# Patient Record
Sex: Male | Born: 1950 | Race: White | Hispanic: No | Marital: Single | Smoking: Never smoker
Health system: Southern US, Community
[De-identification: ages and names within clinical notes are randomized; demographics above are authoritative.]

## PROBLEM LIST (undated history)

## (undated) DIAGNOSIS — E119 Type 2 diabetes mellitus without complications: Secondary | ICD-10-CM

---

## 2006-11-25 ENCOUNTER — Inpatient Hospital Stay (HOSPITAL_COMMUNITY): Admission: AD | Admit: 2006-11-25 | Discharge: 2006-12-05 | Payer: Self-pay | Admitting: Psychiatry

## 2006-11-25 ENCOUNTER — Ambulatory Visit: Payer: Self-pay | Admitting: Psychiatry

## 2006-11-25 ENCOUNTER — Emergency Department (HOSPITAL_COMMUNITY): Admission: EM | Admit: 2006-11-25 | Discharge: 2006-11-25 | Payer: Self-pay | Admitting: Emergency Medicine

## 2006-11-28 ENCOUNTER — Ambulatory Visit: Payer: Self-pay | Admitting: *Deleted

## 2006-12-01 ENCOUNTER — Ambulatory Visit: Admission: RE | Admit: 2006-12-01 | Discharge: 2006-12-01 | Payer: Self-pay | Admitting: Psychiatry

## 2006-12-06 ENCOUNTER — Inpatient Hospital Stay (HOSPITAL_COMMUNITY): Admission: AD | Admit: 2006-12-06 | Discharge: 2006-12-08 | Payer: Self-pay | Admitting: Psychiatry

## 2006-12-09 ENCOUNTER — Ambulatory Visit: Payer: Self-pay | Admitting: Psychiatry

## 2006-12-31 ENCOUNTER — Ambulatory Visit: Admission: RE | Admit: 2006-12-31 | Discharge: 2006-12-31 | Payer: Self-pay | Admitting: Family Medicine

## 2006-12-31 ENCOUNTER — Ambulatory Visit: Payer: Self-pay | Admitting: Vascular Surgery

## 2008-05-10 IMAGING — CT CT CHEST W/ CM
2 of 5 series · 16 of 46 positions shown, 18 images · IV contrast (omnipaque)
Comparison: None

CHEST CT WITH CONTRAST

CLINICAL DATA: Lower extremity edema. Psychosis. Shortness of breath. Nausea.
TECHNIQUE: Multidetector CT imaging of the chest, abdomen, and pelvis was
performed following the standard protocol during bolus administration of
intravenous contrast.

Contrast:  100 cc Omnipaque 300

[Series 2: cap 5.0 b40f · axial · 0.88mm/px · z∈[-646,-76]mm · 13 of 132 slices shown, 15 images]
[im 9/132  soft-tissue]
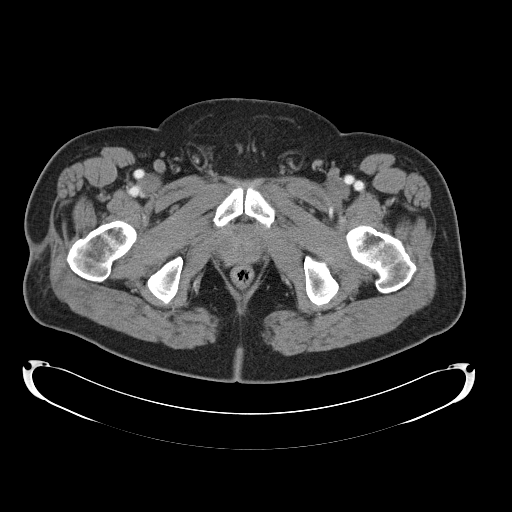
[im 9/132  bone]
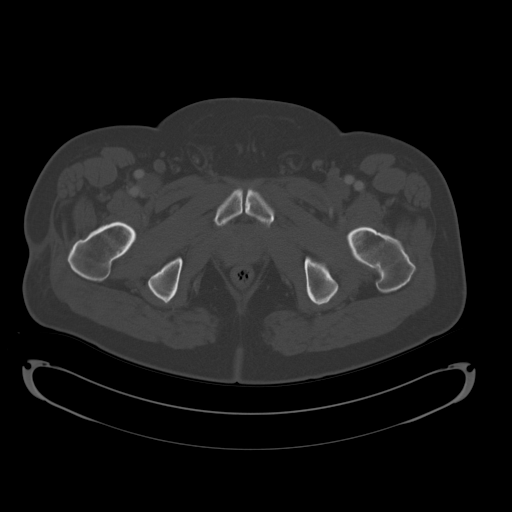
[im 17/132  soft-tissue]
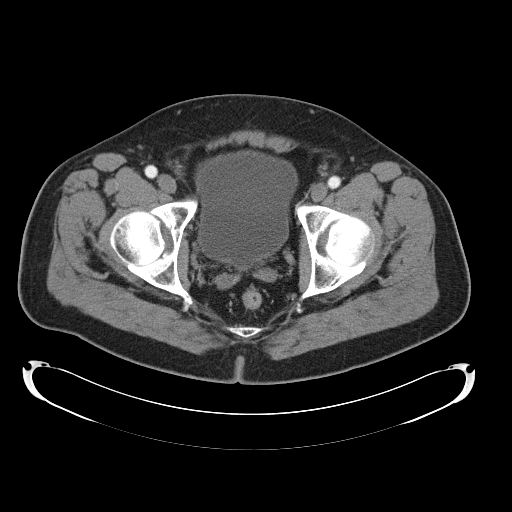
[im 25/132  soft-tissue]
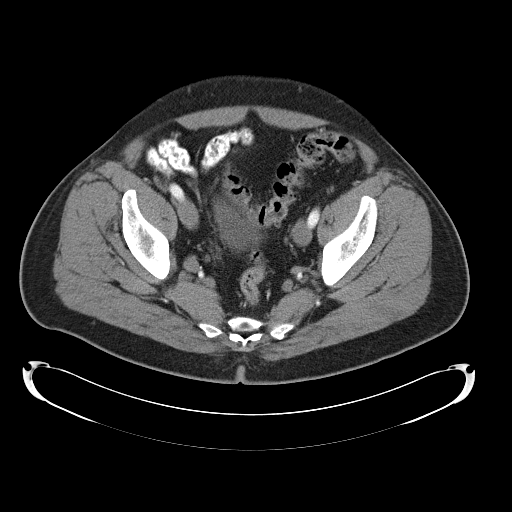
[im 41/132  soft-tissue]
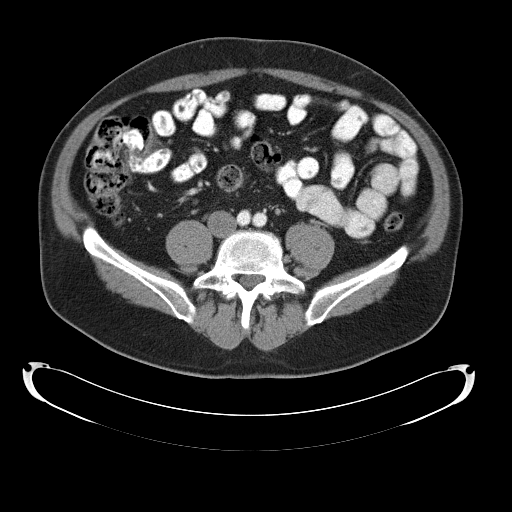
[im 50/132  soft-tissue]
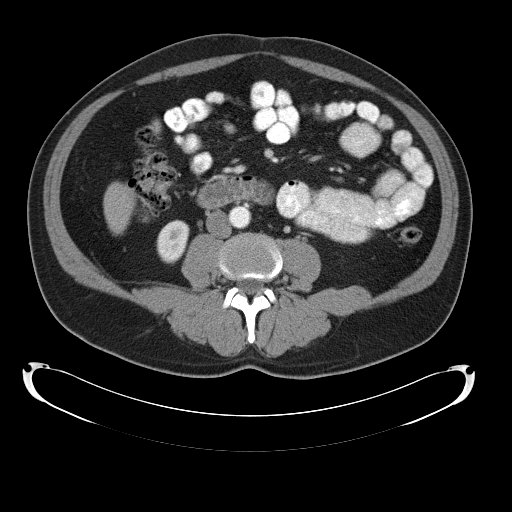
[im 58/132  soft-tissue]
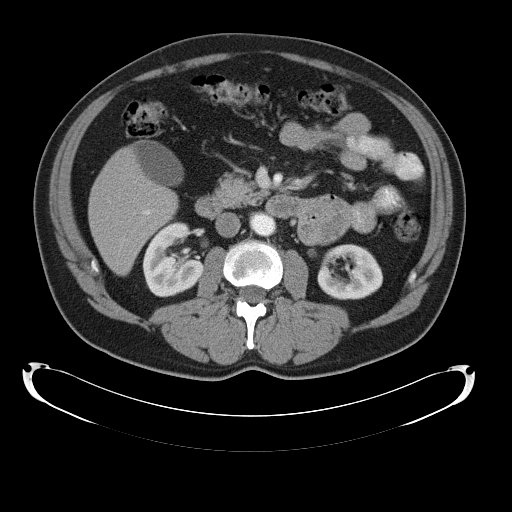
[im 66/132  soft-tissue]
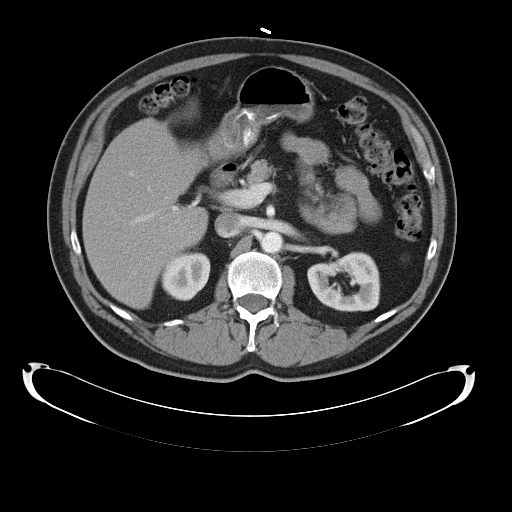
[im 74/132  soft-tissue]
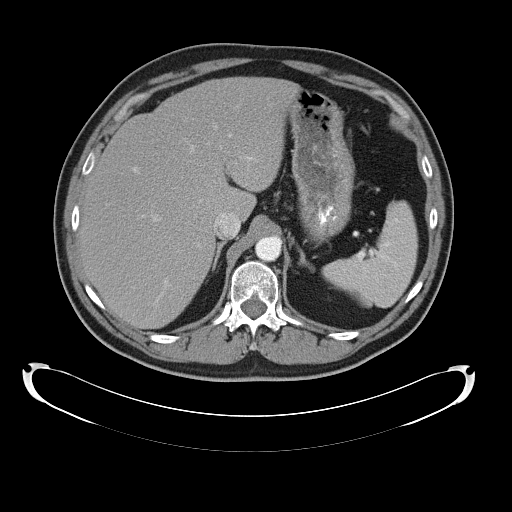
[im 82/132  soft-tissue]
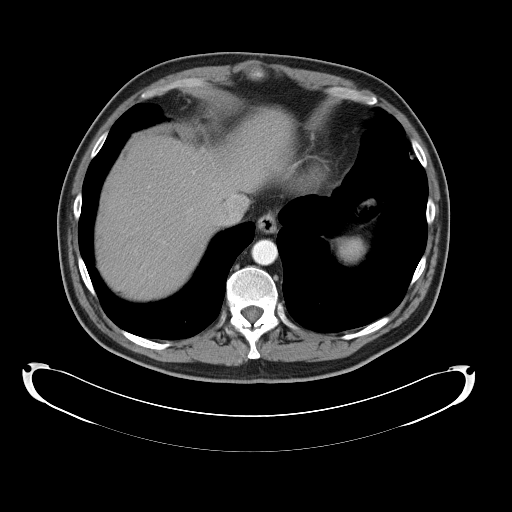
[im 82/132  bone]
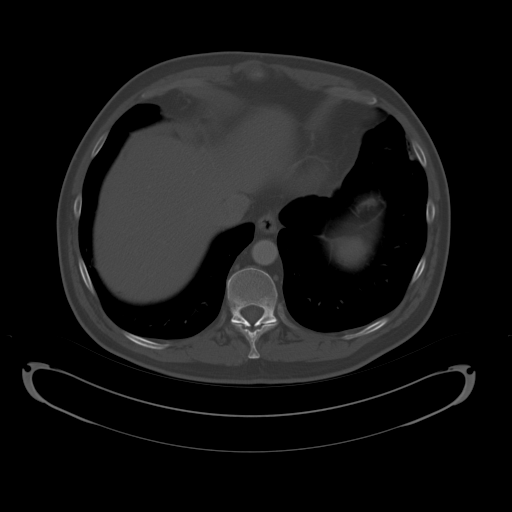
[im 91/132  soft-tissue]
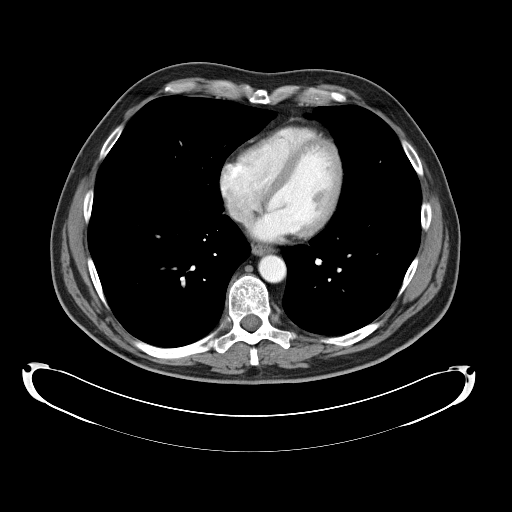
[im 107/132  soft-tissue]
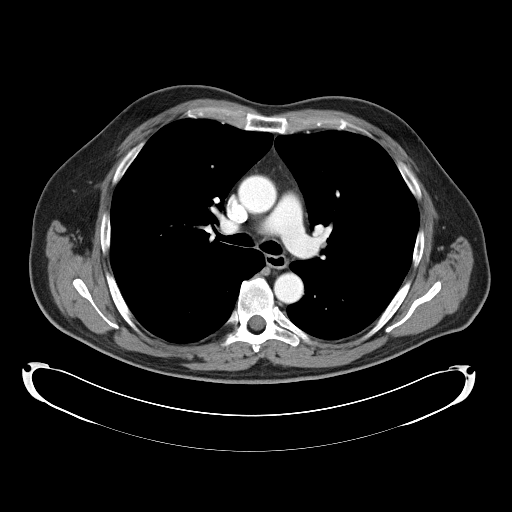
[im 115/132  soft-tissue]
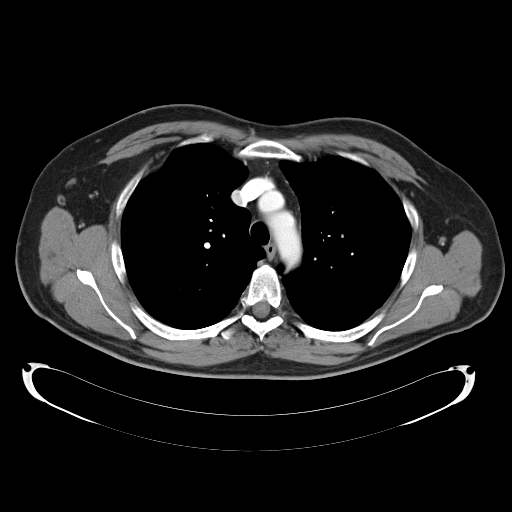
[im 123/132  soft-tissue]
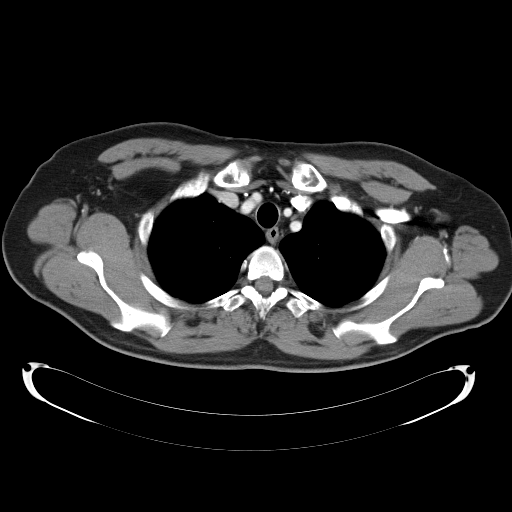

[Series 602: <mpr range> · coronal · 1.28mm/px · 3 of 173 slices shown]
[im 58/173  soft-tissue]
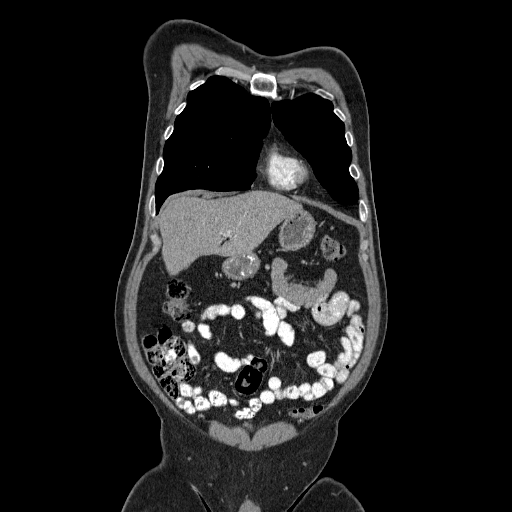
[im 77/173  soft-tissue]
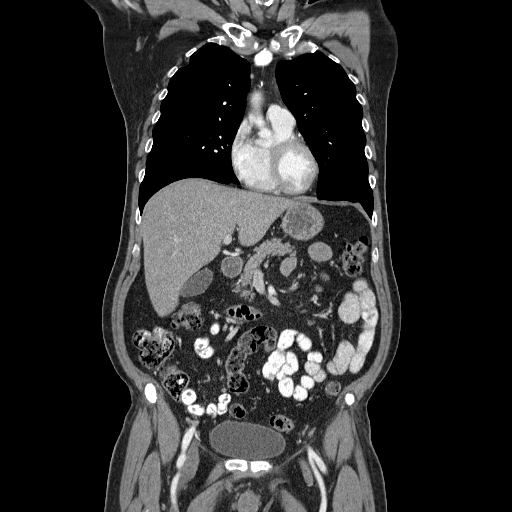
[im 96/173  soft-tissue]
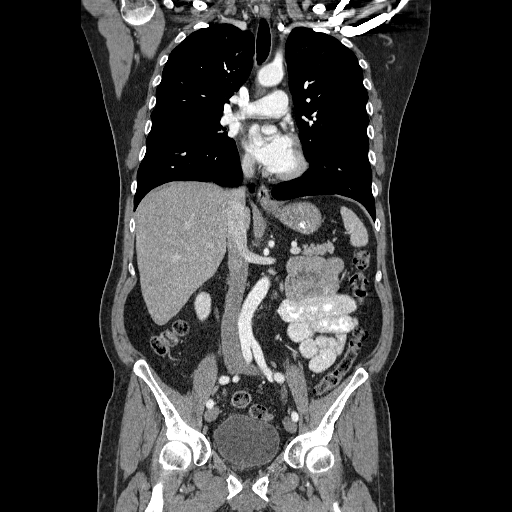

[16 of 46 positions shown; findings below may reference images not displayed]

FINDINGS: No pathologic hilar or mediastinal adenopathy is identified. The
thoracic inlet appears unremarkable. A lipoma posterior to the right pectoralis
minor muscle in the right measures 7.5 x 3.5 x 6.4 cm, and partially involves
the intercostal region between the second and third ribs, with slightly
indentation of the pleural surface along this intercostal space.

A 3 mm faint nodular density in the left upper lobe on image 11 series 4 is
noted. Based on current guidelines, nodules of this size can typically be
assumed to to be benign. Linear opacity in the lingula is noted comment is
compatible lingular scarring or subsegmental atelectasis.

IMPRESSION

1. Lipoma along the right lateral chest wall partially extends into the
intercostal space between the right second and third ribs, and minimally indents
the pleural surface.
2. No acute thoracic findings.

ABDOMEN CT WITH CONTRAST
FINDINGS: The liver, gallbladder, spleen, pancreas, and adrenal glands appear
unremarkable. Than 5 mm hypodensity in the right mid kidney is technically too
small to characterize but statistically likely to represent a small cyst. No
pathologic retroperitoneal adenopathy. No discrete mesenteric mass. The
visualized bowel appears unremarkable.

IMPRESSION

1. No significant upper abdominal abnormality is identified.

PELVIS CT WITH CONTRAST
FINDINGS: The appendix appears normal. The urinary bladder likewise appears
normal. Mild prominence of the prostate gland is noted, without an asymmetric
prostate mass. Small lymph nodes are not pathologically enlarged by CT size
criteria. No free pelvic fluid is noted.

IMPRESSION

No significant pelvic abnormality is identified.

## 2010-08-28 NOTE — H&P (Signed)
[name], [name] NO.:  [id number]   MEDICAL RECORD NO.:  [id number]          PATIENT TYPE:  IPS   LOCATION:  0305                          FACILITY:  [facility]   PHYSICIAN:  Anselm Jungling, MD  DATE OF BIRTH:  1951-02-04   DATE OF ADMISSION:  12/06/2006  DATE OF DISCHARGE:                       PSYCHIATRIC ADMISSION ASSESSMENT   IDENTIFYING INFORMATION:  This is a voluntary admission to the service  of Dr. Geralyn Flash.  This patient was just discharged yesterday and  he is re-presenting as he could not afford his medication and he has  become symptomatic  once again off of it.   The patient originally presented on 8/12.  He was psychotic.  He had  never been treated before and was admitted to the hospital.  He did see  a psychiatry on an outpatient basis for the past 9 years, that was  ending in September of 2007 in [place] when the psychiatrist retired.  He had treated him for ADHD.  Yesterday, the patient reported he could  not afford his prescriptions.  He contacted [facility], but they could not help him since he had not yet been  incorporated into their care.  The patient felt like he would die if he  spends another night at home.  He reported hearing voices.  He felt  targeted.  He stated he was in near hysterics, and he was also  complaining of stabbing pains in his feet, hands, elbow and left flank.  Today, he is wearing hat and an undergarment that have metallic threads  in them.  He states that this helps with the pain and helps decrease the  voices.   PAST PSYCHIATRIC HISTORY:  As already stated, he was just here for a 10  day stay, 8/12 to 12/05/2006, and he has been seen on an outpatient basis  in [place] for 9 years by a Dr. Graylon Gunning.  This ended in 12/2005.   SOCIAL HISTORY:  He is single.  He is a self-employed Tree surgeon.  He is  currently staying with his parents here in [place].   FAMILY HISTORY:  Negative, although  he states his mother is now  experiencing the same symptoms.   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   PRIMARY CARE PHYSICIAN:  A Ruffin Pyo.   MEDICATIONS:  He was discharged on Zyprexa Zydis 15 mg at h.s., Xanax 1  mg b.i.d. p.r.n. and Adderall 20 mg one in the morning at 1 p.m.   ALLERGIES:  No known drug allergies.   POSITIVE PHYSICAL FINDINGS:  PHYSICAL EXAMINATION:  As he was just  discharged, his lab work was not repeated.  His vital signs on admission  show that he is 5 feet 11 inches, weighs 209.  Temperature is 97.7,  blood pressure was 147/87 to 123/88.  Pulse is 91 to 119 and  respirations are 20.   MENTAL STATUS EXAM:  Today, he is alert and oriented  x3.  He is casually  groomed.  He needs to shave.  He is appropriately dressed, although one  can see his metallic undergarment peaking through his shirt, and he does  in fact have the hat which he readily removes.  His motor is otherwise  normal.  His speech:  He is hyperverbal but he is not tangential or  circumstantial.  His mood is appropriate to the situation.  His affect  is slightly anxious.  Thought processes are clear, oriented and goal  oriented.  He wants to get set up with his medications through the  [facility] so he can afford them.  He realizes he needs to take  them.  Concentration and memory are good.  Intelligence is at least  average.  He denies being suicidal or homicidal.  He does in fact have  auditory and tactile hallucinations.  He is reporting pain and he is  also reporting noises.   ADMISSION DIAGNOSES:  AXIS I:  Psychotic disorder, exacerbation of  symptoms due to being not able to afford his medications.  AXIS II:  Deferred.  AXIS III:  History for benign tumor on back and blood clot in right leg.  AXIS IV:  Living environment, access to health care.  AXIS V:  Global assessment of function is  40.   PLAN:  Plan is to admit for safety and stabilization.  We will have the  case manager contact the [facility] in the morning, and we will be  able to discharged him in a day or so.      Mickie Leonarda Salon, P.A.-C.      Anselm Jungling, MD  Electronically Signed    MD/MEDQ  D:  12/07/2006  T:  12/07/2006  Job:  [id number]

## 2010-08-31 NOTE — Discharge Summary (Signed)
[name], [name] NO.:  [id number]   MEDICAL RECORD NO.:  [id number]          PATIENT TYPE:  IPS   LOCATION:  0305                          FACILITY:  BH   PHYSICIAN:  [name], M.D.      DATE OF BIRTH:  1950-07-03   DATE OF ADMISSION:  11/25/2006  DATE OF DISCHARGE:  12/05/2006                               DISCHARGE SUMMARY   CHIEF COMPLAINT AND PRESENT ILLNESS:  This was the first admission to  [facility] for this 60 year old single white male  voluntarily admitted.  History of psychosis.  Experiencing auditory  hallucination, feeling electric shocks, feeling harassed at the  apartment, putting pressure on the elderly.  They put a dog kennel  underneath, feeling targeted.  Decreased sleep.  Would rather be dead  than continue going on like this.   PAST PSYCHIATRIC HISTORY:  First time at [facility].  History of  seasonal depression.  Had been diagnosed ADHD.  Treated with Adderall in  the past.  Has also been on Prozac.   ALCOHOL/DRUG HISTORY:  Denies active alcohol or drug use.   MEDICAL HISTORY:  Noncontributory.   MEDICATIONS:  Xanax and Seroquel.   PHYSICAL EXAMINATION:  Performed and failed to show any acute findings.   LABORATORY DATA:  Results not available in the chart.   MENTAL STATUS EXAM:  Alert male wearing a shiny shirt with some metallic  fabric to stop the electric shocks.  There is some pressured speech.  Very circumstantial at that time, tangential, underlying paranoid  delusional ideas.  Rule out somatic delusions.  Endorsing auditory  hallucinations, somatic hallucinations.  Cognition well-preserved.   ADMISSION DIAGNOSES:  AXIS I:  Psychotic disorder not otherwise  specified.  Rule out bipolar disorder with psychosis.  AXIS II:  No diagnosis.  AXIS III:  No diagnosis.  AXIS IV:  Moderate.  AXIS V:  GAF upon admission 25; highest GAF in the last year 60.   HOSPITAL COURSE:  He was admitted.  He  was started in individual and  group psychotherapy.  He was actively taking no medications.  He was on  Seroquel 300 mg at bedtime and Xanax 1 mg twice a day as needed.  The  Seroquel was increased to try to help the psychotic symptoms.  As  already stated, this is a 60 year old male who resides in [place] who  shared an elaborate story of being in [place] for 35 years, then a  series of events happened in his apartment, felt harassed, felt that  they were trying to get him out of his apartment.  Came to [place] to stay with his parents.  [place] Sp[place] that things started to happen  in [place] too and the mother was experiencing some of the same  things that he was experiencing.  In [place], he covered the window  with aluminum foil to decrease the vibrations.  Has also use aluminum  paper and other metallic fabrics to protect himself.  On this initial  evaluation, there was evidence of ideas of reference, paranoid  delusional  ideas, maybe some somatic delusional ideas as well as  hallucinations.  In the past diagnosed ADHD, given Ritalin and Adderall  30 mg, taking up to 5 of those per day.  Also endorsed taking Seroquel.  Gave a complicated history of why he was not seeing a psychiatrist at  this particular moment.  In [place], he was working in [employer], putting together art schedules and venues.  Continued to  evidence ideas of reference, delusional ideas, perceptual changes,  auditory hallucinations.  Sleep was a major concern.  He was willing to  consider that what he was experiencing was not based on reality and was  willing to continue the medications.  Continued to endorse pain, burning  neck, down all night saying secondary to the waves.  There were some  swelling on his legs and internal medicine was consulted.  Full organic  workup for his symptoms was made including a CT of the neck, of the head  that was negative.  On November 29, 2006, he was endorsing that  the  Seroquel was making things worse for him, knocks him down, makes him  restless but not able to sleep.  There is still evidence of delusional  ideas.  He was pretty grateful that we were addressing his concerns and  doing an organic workup for his symptoms.  We switched to Zyprexa and he  did better on Zyprexa.  On November 30, 2006, sleep was an issue, more so  because of the roommate, but he was able to sleep in the quiet room.  Felt that the thicker walls were more so able to protect from the  outside, the radiation, etc.  We continued to increase the Zyprexa and  we also worked to increase the reality testing.  There was still  evidence of delusional ideas and hallucinations on December 01, 2006 but  sleep got better, somewhat sedated in the morning, uncomfortable with  the way he was feeling but willing to continue to use the medication if  he was to adjust to it.  Voices, on December 01, 2006, were telling him  Altamese Cabal Christmas, you're retarded.  He felt pretty burned out of the  situation, some of the voices were of people from [place] so we tried  to challenge the reality of this happening and introduced some doubt in  terms of how much he believed in these psychotic symptoms.  On December 02, 2006, difficulty with his mood but the voices were still there but  they had muffled.  Main concern was not really the voices but the pain,  feeling that he is just more sedated but still with the pain.  Some  dysphoric mood.  Affect somewhat irritable, marked change from the  initial evaluation, when he was more expansive.  To be more depressed,  we considered the possibility of bipolarity.  By December 03, 2006, he was  relieved when he found out that the organic workup was negative so now  he can tell the voices that he was not going to die.  Voices have  muffled and he did sleep better the night in his own room.  There was  marked decrease in the dysphoria, less labile.  We pursued the  Zyprexa,  increased it to 20 mg, considering all the pros versus the risks.  On  December 04, 2006, woke up at 3 in the morning.  Voices have decreased,  worried about his functioning once he moves to  [place].  Was on  Adderall for nine years.  Did have to increase the Adderall due to the  tolerance.  There was some anxiety, worries, ruminating about how he was  going to function once he was back in [place].  We gave a smaller  amount of Adderall a try.  We tried to optimize treatment.  On December 05, 2006, he was endorsing that the Adderall helped him.  Was very short-  lived after the affect was gone.  He felt back to the way he was feeling  before but he was not feeling any worse so we increased the Adderall to  20 mg in the morning.  Did endorse that the Adderall made him be more  clear-headed, more able to engage.  Endorsed that the voices were gone.  There were no overt side effects for what we went ahead and discharged  to outpatient follow-up.   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  Attention-deficit  hyperactivity disorder.  AXIS II:  No diagnosis.  AXIS III:  No diagnosis.  AXIS IV:  Moderate.  AXIS V:  GAF upon discharge 50.   DISCHARGE MEDICATIONS:  1. Zyprexa Zydis 15 mg at bedtime.  2. Xanax 1 mg twice a day as needed.  3. Adderall 20 mg, 1 in the morning and 1 at 1 p.m.   FOLLOWUP:  Dr. Lang Snow at [facility].      [name], M.D.  Electronically Signed     IL/MEDQ  D:  01/01/2007  T:  01/02/2007  Job:  [id number]

## 2011-01-25 LAB — HEPATIC FUNCTION PANEL
Albumin: 3.4 — ABNORMAL LOW
Alkaline Phosphatase: 88
Bilirubin, Direct: <0.1
Total Bilirubin: 0.8

## 2011-01-28 LAB — CBC
MCV: 87.9
Platelets: 330
WBC: 8.2

## 2011-01-28 LAB — URINALYSIS, ROUTINE W REFLEX MICROSCOPIC
Glucose, UA: NEGATIVE
Protein, ur: NEGATIVE
Urobilinogen, UA: 0.2

## 2011-01-28 LAB — DIFFERENTIAL
Basophils Relative: 1
Eosinophils Absolute: 0.1
Lymphs Abs: 3
Neutro Abs: 4.2
Neutrophils Relative %: 52

## 2011-01-28 LAB — RAPID URINE DRUG SCREEN, HOSP PERFORMED: Tetrahydrocannabinol: NOT DETECTED

## 2011-01-28 LAB — BASIC METABOLIC PANEL
BUN: 10
Calcium: 9.4
Chloride: 101
Creatinine, Ser: 1.13

## 2011-01-28 LAB — URINE MICROSCOPIC-ADD ON

## 2011-01-28 LAB — ETHANOL: Alcohol, Ethyl (B): <5

## 2017-04-20 ENCOUNTER — Encounter (HOSPITAL_COMMUNITY): Payer: Self-pay | Admitting: Emergency Medicine

## 2017-04-20 ENCOUNTER — Emergency Department (HOSPITAL_COMMUNITY)
Admission: EM | Admit: 2017-04-20 | Discharge: 2017-04-20 | Disposition: A | Discharge status: Home / Self Care | Payer: Self-pay | Attending: Emergency Medicine | Admitting: Emergency Medicine

## 2017-04-20 DIAGNOSIS — R739 Hyperglycemia, unspecified: Secondary | ICD-10-CM

## 2017-04-20 DIAGNOSIS — Z7982 Long term (current) use of aspirin: Secondary | ICD-10-CM | POA: Insufficient documentation

## 2017-04-20 DIAGNOSIS — E1165 Type 2 diabetes mellitus with hyperglycemia: Secondary | ICD-10-CM | POA: Insufficient documentation

## 2017-04-20 HISTORY — DX: Type 2 diabetes mellitus without complications: E11.9

## 2017-04-20 LAB — CBG MONITORING, ED: GLUCOSE-CAPILLARY: 302 mg/dL — AB (ref 65–99)

## 2017-04-20 LAB — CBC
HCT: 39.2 % (ref 39.0–52.0)
Hemoglobin: 13.3 g/dL (ref 13.0–17.0)
MCH: 29.8 pg (ref 26.0–34.0)
MCHC: 33.9 g/dL (ref 30.0–36.0)
MCV: 87.9 fL (ref 78.0–100.0)
PLATELETS: 314 10*3/uL (ref 150–400)
RBC: 4.46 MIL/uL (ref 4.22–5.81)
RDW: 12.9 % (ref 11.5–15.5)
WBC: 8.6 10*3/uL (ref 4.0–10.5)

## 2017-04-20 LAB — BLOOD GAS, VENOUS
Acid-Base Excess: 2.1 mmol/L — ABNORMAL HIGH (ref 0.0–2.0)
Bicarbonate: 26.7 mmol/L (ref 20.0–28.0)
O2 Saturation: 84.6 %
PCO2 VEN: 43.6 mmHg — AB (ref 44.0–60.0)
PH VEN: 7.404 (ref 7.250–7.430)
Patient temperature: 98.6
pO2, Ven: 50.5 mmHg — ABNORMAL HIGH (ref 32.0–45.0)

## 2017-04-20 LAB — BASIC METABOLIC PANEL
Anion gap: 10 (ref 5–15)
BUN: 14 mg/dL (ref 6–20)
CALCIUM: 9.2 mg/dL (ref 8.9–10.3)
CO2: 25 mmol/L (ref 22–32)
CREATININE: 0.68 mg/dL (ref 0.61–1.24)
Chloride: 99 mmol/L — ABNORMAL LOW (ref 101–111)
GFR calc Af Amer: >60 mL/min (ref >60)
Glucose, Bld: 281 mg/dL — ABNORMAL HIGH (ref 65–99)
Potassium: 4.2 mmol/L (ref 3.5–5.1)
SODIUM: 134 mmol/L — AB (ref 135–145)

## 2017-04-20 MED ORDER — BLOOD GLUCOSE MONITORING SUPPL MISC: 1.0000 | Freq: Every day | 0 refills | Status: DC

## 2017-04-20 MED ORDER — METFORMIN HCL 1000 MG PO TABS: 1000.0000 mg | ORAL_TABLET | Freq: Two times a day (BID) | ORAL | 0 refills | Status: DC

## 2017-04-20 MED ORDER — INSULIN GLARGINE 100 UNIT/ML ~~LOC~~ SOLN: 15.0000 [IU] | Freq: Every day | SUBCUTANEOUS | 0 refills | Status: DC

## 2017-04-20 NOTE — ED Notes (Signed)
Signature pad is not working, unable to obtain Black & Deckersignatrure

## 2017-04-20 NOTE — ED Notes (Signed)
Bed: WA22 Expected date:  Expected time:  Means of arrival:  Comments: 

## 2017-04-20 NOTE — ED Triage Notes (Signed)
Pt c/o hyperglycemia, ems reports patient visiting from [place] and didn't bring any medicine with him, pt takes metformin and insulin.

## 2017-04-20 NOTE — ED Provider Notes (Signed)
[facility]-EMERGENCY DEPT Provider Note   CSN: [id number] Arrival date & time: 04/20/17  [id number]   History   Chief Complaint Chief Complaint  Patient presents Burns  . Hyperglycemia    HPI Al PimpleDouglas Burns is a 67 y.o. male.  HPI Pt states he take metformin and long acting insulin at night.  Pt had an emergency Burns his mother and he had to rapidly transit down to [place] and he did not bring his medications.  Pt thought he would initially be here for a few days so he did not bother to bring them.  Patient denies any trouble Burns nausea and vomiting.  No numbness or weakness.  No fevers or chills. Past Medical History:  Diagnosis Date  . Diabetes mellitus without complication (HCC)     There are no active problems to display for this patient.   History reviewed. No pertinent surgical history.     Home Medications    Prior to Admission medications   Medication Sig Start Date End Date Taking? Authorizing Provider  aspirin EC 81 MG tablet Take 162 mg by mouth daily.   Yes [provider]  clonazePAM (KLONOPIN) 1 MG tablet Take 1-2 mg by mouth 4 (four) times daily as needed for anxiety.   Yes [provider]  diphenhydrAMINE (BENADRYL) 25 MG tablet Take 25 mg by mouth daily as needed for sleep.   Yes [provider]  insulin glargine (LANTUS) 100 UNIT/ML injection Inject 0.15 mLs (15 Units total) into the skin at bedtime. 04/20/17   Linwood DibblesKnapp, Latasha Buczkowski, MD  metFORMIN (GLUCOPHAGE) 1000 MG tablet Take 1 tablet (1,000 mg total) by mouth 2 (two) times daily. 04/20/17 05/20/17  Linwood DibblesKnapp, Tedra Coppernoll, MD  Metformin 1000 mg bid Insulin Lantus 15 U qhs  Family History Family History  Problem Relation Age of Onset  . Stroke Mother   . Cancer Father     Social History Social History   Tobacco Use  . Smoking status: Never Smoker  . Smokeless tobacco: Never Used  Substance Use Topics  . Alcohol use: Yes  . Drug use: No     Allergies   Patient has no known  allergies.   Review of Systems Review of Systems  Constitutional: Negative for fever.  Gastrointestinal: Negative for diarrhea and vomiting.  Psychiatric/Behavioral: Negative for confusion, dysphoric mood, hallucinations and self-injury. The patient is not nervous/anxious and is not hyperactive.   All other systems reviewed and are negative.    Physical Exam Updated Vital Signs BP 116/84 (BP Location: Right Arm)   Pulse 95   Temp 98 F (36.7 C) (Oral)   Resp 16   SpO2 97%   Physical Exam  Constitutional: He appears well-developed and well-nourished. No distress.  HENT:  Head: Normocephalic and atraumatic.  Right Ear: External ear normal.  Left Ear: External ear normal.  Eyes: Conjunctivae are normal. Right eye exhibits no discharge. Left eye exhibits no discharge. No scleral icterus.  Neck: Neck supple. No tracheal deviation present.  Cardiovascular: Normal rate.  Pulmonary/Chest: Effort normal. No stridor. No respiratory distress.  Abdominal: He exhibits no distension.  Musculoskeletal: He exhibits no edema.  Neurological: He is alert. Cranial nerve deficit: no gross deficits.  Skin: Skin is warm and dry. No rash noted.  Psychiatric: He has a normal mood and affect.  Pt is very tangential, provides a lot of extraneous information about a lawsuit and people trying to get control of an expensive property that he own.  Nursing note and  vitals reviewed.    ED Treatments / Results  Labs (all labs ordered are listed, but only abnormal results are displayed) Labs Reviewed  BASIC METABOLIC PANEL - Abnormal; Notable for the following components:      Result Value   Sodium 134 (*)    Chloride 99 (*)    Glucose, Bld 281 (*)    All other components within normal limits  BLOOD GAS, VENOUS - Abnormal; Notable for the following components:   pCO2, Ven 43.6 (*)    pO2, Ven 50.5 (*)    Acid-Base Excess 2.1 (*)    All other components within normal limits  CBG MONITORING, ED -  Abnormal; Notable for the following components:   Glucose-Capillary 302 (*)    All other components within normal limits  CBC     Radiology No results found.  Procedures Procedures (including critical care time)  Medications Ordered in ED Medications - No data to display   Initial Impression / Assessment and Plan / ED Course  I have reviewed the triage vital signs and the nursing notes.  Pertinent labs & imaging results that were available during my care of the patient were reviewed by me and considered in my medical decision making (see chart for details).   Patient presents to the emergency room Burns complaints of hyperglycemia because he did not bring his medications Burns him.  Patient is not having any acute complications.  No nausea vomiting.  No abdominal pain no signs of infection.  Patient states he takes metformin 1000 mg twice daily and Lantus daily.  I will give him a  prescription for those medications  Final Clinical Impressions(s) / ED Diagnoses   Final diagnoses:  Hyperglycemia    ED Discharge Orders        Ordered    metFORMIN (GLUCOPHAGE) 1000 MG tablet  2 times daily     04/20/17 1449    insulin glargine (LANTUS) 100 UNIT/ML injection  Daily at bedtime     04/20/17 1449       Linwood Dibbles, MD 04/20/17 1450

## 2017-04-20 NOTE — Discharge Instructions (Signed)
Follow-up with your primary doctor when you return home, take your metformin and Lantus per your usual prescription

## 2017-06-12 ENCOUNTER — Other Ambulatory Visit: Payer: Self-pay

## 2017-06-12 ENCOUNTER — Emergency Department (HOSPITAL_COMMUNITY): Payer: Medicare HMO

## 2017-06-12 ENCOUNTER — Emergency Department (HOSPITAL_COMMUNITY)
Admission: EM | Admit: 2017-06-12 | Discharge: 2017-06-13 | Disposition: A | Discharge status: Other Acute Inpatient Hospital | Payer: Medicare HMO | Attending: Emergency Medicine | Admitting: Emergency Medicine

## 2017-06-12 ENCOUNTER — Encounter (HOSPITAL_COMMUNITY): Payer: Self-pay

## 2017-06-12 DIAGNOSIS — R440 Auditory hallucinations: Secondary | ICD-10-CM | POA: Diagnosis not present

## 2017-06-12 DIAGNOSIS — E119 Type 2 diabetes mellitus without complications: Secondary | ICD-10-CM | POA: Insufficient documentation

## 2017-06-12 DIAGNOSIS — R748 Abnormal levels of other serum enzymes: Secondary | ICD-10-CM | POA: Diagnosis not present

## 2017-06-12 DIAGNOSIS — Z794 Long term (current) use of insulin: Secondary | ICD-10-CM | POA: Diagnosis not present

## 2017-06-12 DIAGNOSIS — Z049 Encounter for examination and observation for unspecified reason: Secondary | ICD-10-CM

## 2017-06-12 DIAGNOSIS — F22 Delusional disorders: Secondary | ICD-10-CM | POA: Insufficient documentation

## 2017-06-12 DIAGNOSIS — Z046 Encounter for general psychiatric examination, requested by authority: Secondary | ICD-10-CM | POA: Diagnosis present

## 2017-06-12 LAB — COMPREHENSIVE METABOLIC PANEL
ALT: 451 U/L — ABNORMAL HIGH (ref 17–63)
ANION GAP: 10 (ref 5–15)
AST: 96 U/L — AB (ref 15–41)
Albumin: 4.1 g/dL (ref 3.5–5.0)
Alkaline Phosphatase: 158 U/L — ABNORMAL HIGH (ref 38–126)
BILIRUBIN TOTAL: 0.5 mg/dL (ref 0.3–1.2)
BUN: 14 mg/dL (ref 6–20)
CHLORIDE: 108 mmol/L (ref 101–111)
CO2: 23 mmol/L (ref 22–32)
Calcium: 9.6 mg/dL (ref 8.9–10.3)
Creatinine, Ser: 0.71 mg/dL (ref 0.61–1.24)
GFR calc Af Amer: >60 mL/min (ref >60)
Glucose, Bld: 169 mg/dL — ABNORMAL HIGH (ref 65–99)
Potassium: 3.7 mmol/L (ref 3.5–5.1)
Sodium: 141 mmol/L (ref 135–145)
TOTAL PROTEIN: 7 g/dL (ref 6.5–8.1)

## 2017-06-12 LAB — RAPID URINE DRUG SCREEN, HOSP PERFORMED
AMPHETAMINES: NOT DETECTED
BENZODIAZEPINES: POSITIVE — AB
Barbiturates: NOT DETECTED
COCAINE: NOT DETECTED
OPIATES: NOT DETECTED
Tetrahydrocannabinol: NOT DETECTED

## 2017-06-12 LAB — URINALYSIS, ROUTINE W REFLEX MICROSCOPIC
Bilirubin Urine: NEGATIVE
GLUCOSE, UA: NEGATIVE mg/dL
Hgb urine dipstick: NEGATIVE
KETONES UR: NEGATIVE mg/dL
NITRITE: NEGATIVE
PROTEIN: NEGATIVE mg/dL
Specific Gravity, Urine: 1.018 (ref 1.005–1.030)
pH: 5 (ref 5.0–8.0)

## 2017-06-12 LAB — CBC WITH DIFFERENTIAL/PLATELET
Basophils Absolute: 0.1 10*3/uL (ref 0.0–0.1)
Basophils Relative: 1 %
EOS ABS: 0.4 10*3/uL (ref 0.0–0.7)
EOS PCT: 4 %
HCT: 39.1 % (ref 39.0–52.0)
Hemoglobin: 12.6 g/dL — ABNORMAL LOW (ref 13.0–17.0)
LYMPHS ABS: 3.4 10*3/uL (ref 0.7–4.0)
LYMPHS PCT: 40 %
MCH: 29.9 pg (ref 26.0–34.0)
MCHC: 32.2 g/dL (ref 30.0–36.0)
MCV: 92.9 fL (ref 78.0–100.0)
MONOS PCT: 7 %
Monocytes Absolute: 0.6 10*3/uL (ref 0.1–1.0)
Neutro Abs: 4 10*3/uL (ref 1.7–7.7)
Neutrophils Relative %: 48 %
PLATELETS: 326 10*3/uL (ref 150–400)
RBC: 4.21 MIL/uL — AB (ref 4.22–5.81)
RDW: 14 % (ref 11.5–15.5)
WBC: 8.4 10*3/uL (ref 4.0–10.5)

## 2017-06-12 LAB — CBG MONITORING, ED
GLUCOSE-CAPILLARY: 189 mg/dL — AB (ref 65–99)
Glucose-Capillary: 182 mg/dL — ABNORMAL HIGH (ref 65–99)
Glucose-Capillary: 198 mg/dL — ABNORMAL HIGH (ref 65–99)
Glucose-Capillary: 151 mg/dL — ABNORMAL HIGH (ref 65–99)

## 2017-06-12 LAB — ACETAMINOPHEN LEVEL

## 2017-06-12 LAB — AMMONIA: Ammonia: 12 umol/L (ref 9–35)

## 2017-06-12 LAB — ETHANOL

## 2017-06-12 MED ORDER — CLONAZEPAM 0.5 MG PO TABS
0.5000 mg | ORAL_TABLET | Freq: Every day | ORAL | Status: DC
  Administered 2017-06-12 – 2017-06-13 (×2): 0.5 mg via ORAL
  Filled 2017-06-12 (×2): qty 1

## 2017-06-12 MED ORDER — INSULIN GLARGINE 100 UNIT/ML ~~LOC~~ SOLN
15.0000 [IU] | Freq: Every day | SUBCUTANEOUS | Status: DC
  Administered 2017-06-12: 15 [IU] via SUBCUTANEOUS
  Filled 2017-06-12: qty 0.15

## 2017-06-12 MED ORDER — INSULIN ASPART 100 UNIT/ML ~~LOC~~ SOLN
0.0000 [IU] | Freq: Three times a day (TID) | SUBCUTANEOUS | Status: DC
  Administered 2017-06-12 – 2017-06-13 (×4): 3 [IU] via SUBCUTANEOUS
  Filled 2017-06-12 (×4): qty 1

## 2017-06-12 MED ORDER — OLANZAPINE 5 MG PO TBDP: 5.0000 mg | ORAL_TABLET | Freq: Every day | ORAL | Status: DC

## 2017-06-12 MED ORDER — RISPERIDONE 0.5 MG PO TABS
0.5000 mg | ORAL_TABLET | Freq: Once | ORAL | Status: AC
  Administered 2017-06-12: 0.5 mg via ORAL
  Filled 2017-06-12: qty 1

## 2017-06-12 MED ORDER — ALUM & MAG HYDROXIDE-SIMETH 200-200-20 MG/5ML PO SUSP: 30.0000 mL | Freq: Four times a day (QID) | ORAL | Status: DC | PRN

## 2017-06-12 MED ORDER — CLONAZEPAM 0.5 MG PO TABS: 1.0000 mg | ORAL_TABLET | Freq: Two times a day (BID) | ORAL | Status: DC | PRN

## 2017-06-12 MED ORDER — ONDANSETRON HCL 4 MG PO TABS: 4.0000 mg | ORAL_TABLET | Freq: Three times a day (TID) | ORAL | Status: DC | PRN

## 2017-06-12 MED ORDER — ACETAMINOPHEN 325 MG PO TABS
650.0000 mg | ORAL_TABLET | ORAL | Status: DC | PRN
  Administered 2017-06-12 – 2017-06-13 (×3): 650 mg via ORAL
  Filled 2017-06-12 (×3): qty 2

## 2017-06-12 MED ORDER — METFORMIN HCL 500 MG PO TABS
1000.0000 mg | ORAL_TABLET | Freq: Two times a day (BID) | ORAL | Status: DC
  Administered 2017-06-12 – 2017-06-13 (×3): 1000 mg via ORAL
  Filled 2017-06-12 (×3): qty 2

## 2017-06-12 NOTE — ED Notes (Signed)
Patient aware urine sample is needed. Patient reminded and water given.

## 2017-06-12 NOTE — Progress Notes (Addendum)
Called [phone], unable to get an answer to give report, and leave a message.

## 2017-06-12 NOTE — ED Notes (Signed)
Patient is aware urine sample is needed. States he is unable to at his time.

## 2017-06-12 NOTE — ED Notes (Signed)
Unsuccessful attempt to draw ammonia lab x2, Sam, NT will attempt.

## 2017-06-12 NOTE — ED Triage Notes (Signed)
Pt brought in from home via EMS for c/o dizziness  Pt states the dizziness is from the electromagnetic field and he thinks the tin foil blanket that he made himself has made it progressively worse   Pt states once he got to the EMS truck he felt better

## 2017-06-12 NOTE — Progress Notes (Addendum)
CSW received phone call from [facility]. Provided fax number again, [phone]. CSW relayed this information to pt's nurse.   5:39 CSW called [place], Shanda BumpsJessica confirmed receipt of Findings and custody.   Number for Report: [phone].   Transportation for pt to hospital can be set up.   Montine CircleKelsy Siler Mavis, Silverio LayLCSWA [facility]  [phone]

## 2017-06-12 NOTE — Progress Notes (Signed)
Patient accepted to [facility] [place], [facility]iatric Psych per Dr. Loleta ChanceHill.  Fax IVC paper work to [phone].  Call report to [phone] or the cell number at [phone].    Nanine MeansJamison Lord, PMHNP

## 2017-06-12 NOTE — ED Notes (Signed)
Bed: [id number] Expected date:  Expected time:  Means of arrival:  Comments: RM 14

## 2017-06-12 NOTE — BH Assessment (Signed)
[facility]  Per Juanetta BeetsJacqueline Norman, DO, this pt requires psychiatric hospitalization at this time. Shanda BumpsJessica calls from [facility] to report that pt has been accepted to their facility by Dr Loleta ChanceHill, contingent upon pt being placed under IVC.  Dr Sharma CovertNorman concurs with this decision, and has initiated IVC.  IVC documents have been fax[place] to [facility] [place] [facility], and at 16:15 Hortense RamalMagistrate Watts confirms receipt.  As of this writing, service of Findings and Custody Order is pending.  Petition and First Examination have been faxed to [facility], and Shanda BumpsJessica [phone] or [phone]) confirms receipt.  She agrees to provide telephone number for report after she receives Findings and Custody Order.  Pt's nurse, Angelique BlonderDenise [phone]), has been notified, and agrees to fax the required form to [phone].  Adelina MingsKelsey, LCSW [phone]) agrees to serve as point of contact pending completion of these details.  [facility] [place] [facility] when the time comes.  Doylene Canninghomas Danaye Sobh Behavioral Health Coordinator [phone]

## 2017-06-12 NOTE — ED Notes (Signed)
Faxed documentation to [phone]
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
## 2017-06-12 NOTE — BH Assessment (Addendum)
[facility]  Per Juanetta BeetsJacqueline Norman, DO, this pt requires psychiatric hospitalization at this time.  This patient is currently under voluntary status.  The following facilities have been contacted to seek placement for this pt, with results as noted:  Beds available, information sent, decision pending:  [facility]   [facility]:  [facility] [address] [facility]   [place] Nemiah [place], [place] Autoliv[facility]al Health Coordinator [phone]

## 2017-06-12 NOTE — ED Notes (Signed)
Patient sleeping at this time.

## 2017-06-12 NOTE — Progress Notes (Signed)
CSW [facility] and confirmed that [phone] is the correct number to call report to.   Montine CircleKelsy Jewell Haught, Silverio LayLCSWA [facility]  [phone]

## 2017-06-12 NOTE — ED Notes (Signed)
Patient changed into purple scrubs (since he changed himself out of gown and into street clothes) Patient and belongings wanded by security.
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
## 2017-06-13 DIAGNOSIS — F22 Delusional disorders: Secondary | ICD-10-CM | POA: Diagnosis not present

## 2017-06-13 LAB — CBG MONITORING, ED: Glucose-Capillary: 189 mg/dL — ABNORMAL HIGH (ref 65–99)

## 2017-06-13 LAB — HEPATITIS PANEL, ACUTE
HEP B S AG: NEGATIVE
Hep A IgM: NEGATIVE
Hep B C IgM: NEGATIVE

## 2017-06-13 MED ORDER — CEPHALEXIN 500 MG PO CAPS
500.0000 mg | ORAL_CAPSULE | Freq: Three times a day (TID) | ORAL | Status: DC
  Administered 2017-06-13: 500 mg via ORAL
  Filled 2017-06-13: qty 1

## 2017-06-13 MED ORDER — RISPERIDONE 0.5 MG PO TABS
0.5000 mg | ORAL_TABLET | Freq: Once | ORAL | Status: AC
  Administered 2017-06-13: 0.5 mg via ORAL
  Filled 2017-06-13: qty 1

## 2017-06-13 NOTE — ED Notes (Signed)
[facility] received recording for Sgt. Paschal.  Message left requesting return call.

## 2017-06-13 NOTE — ED Notes (Signed)
Sheriff's office called for transportation again as they needed clarification regarding accepting hospital.

## 2017-06-13 NOTE — ED Notes (Signed)
Report given to Kimberly RN.

## 2017-06-13 NOTE — ED Notes (Signed)
Pt up & requesting tylenol stating "I hurt all over".

## 2017-06-13 NOTE — ED Notes (Signed)
Denture cup provided.

## 2018-09-13 IMAGING — US US ABDOMEN LIMITED
1 series · 14 of 25 positions shown · non-contrast
Comparison: None.

CLINICAL DATA: 66-year-old male with elevated liver enzymes.

EXAM:
ULTRASOUND ABDOMEN LIMITED RIGHT UPPER QUADRANT

[Series 1: us abdomen limited · 0.26mm/px · 14 of 43 slices shown]
[im 1/43]
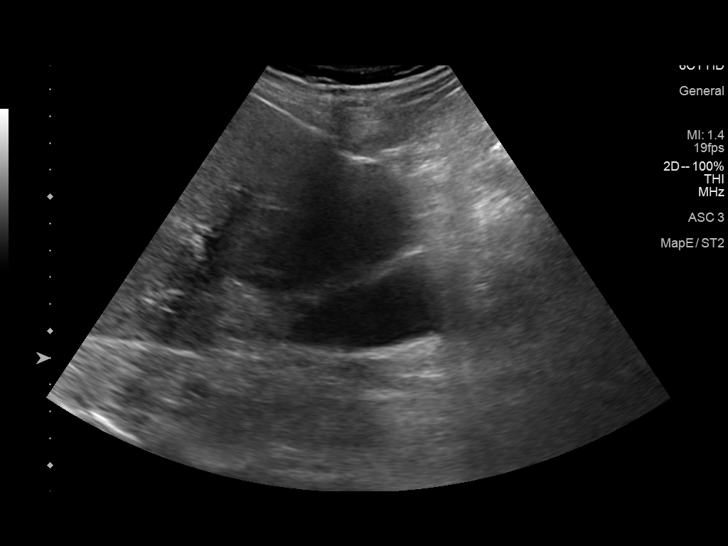
[im 4/43]
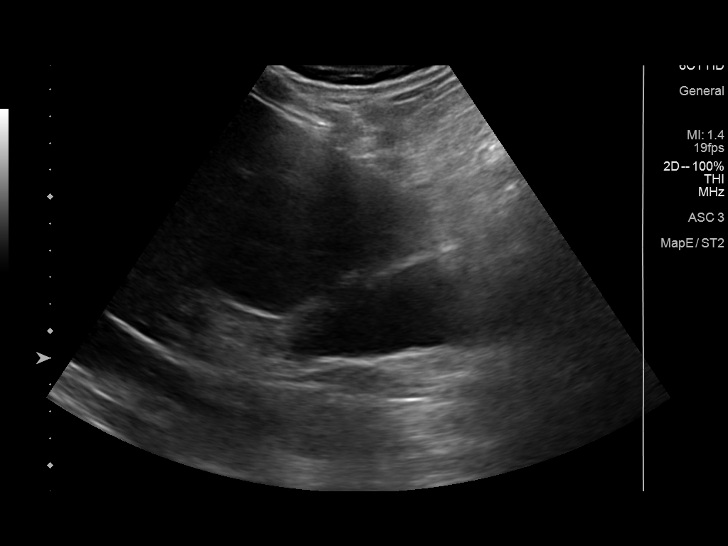
[im 8/43]
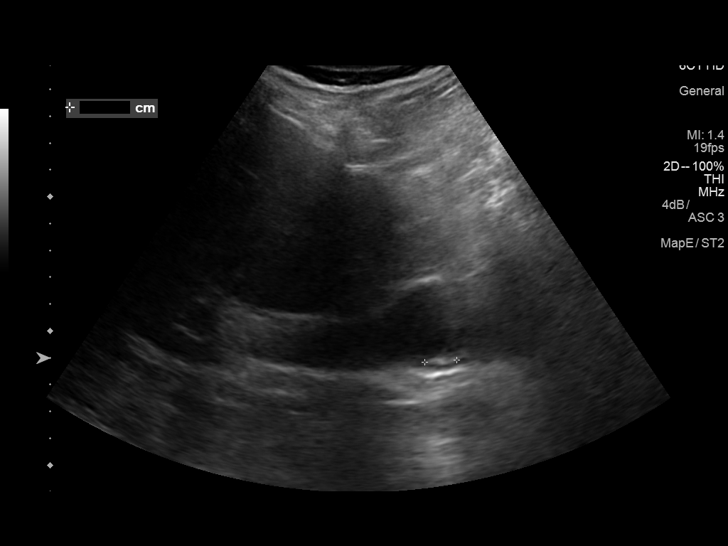
[im 11/43]
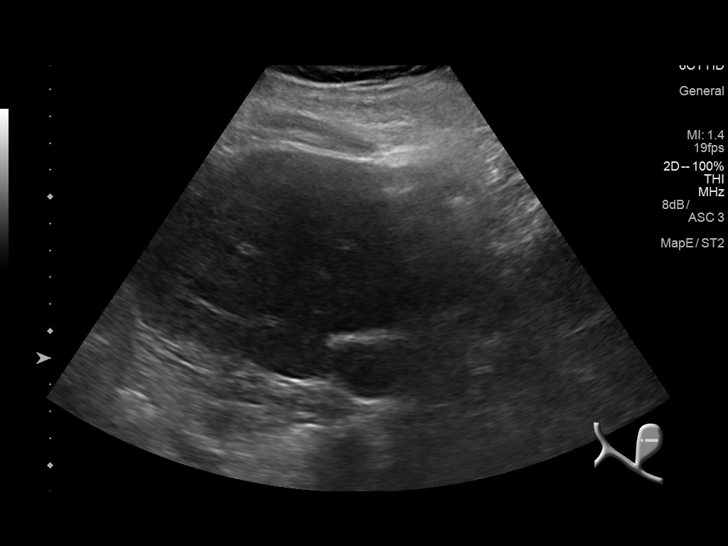
[im 15/43]
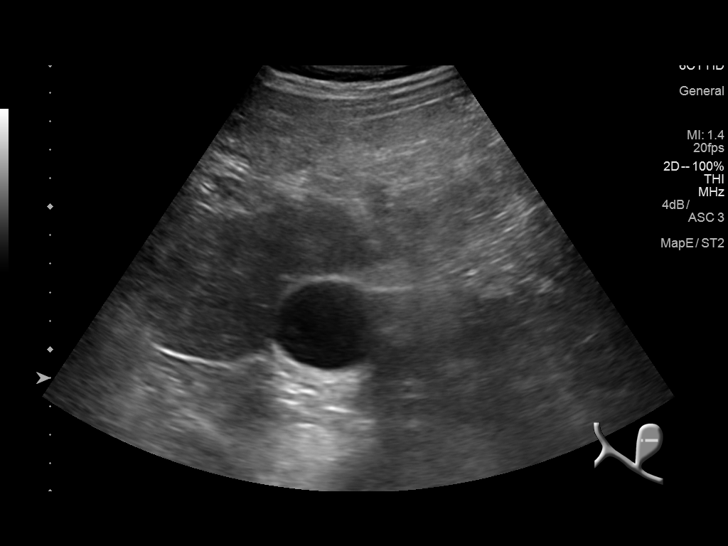
[im 16/43]
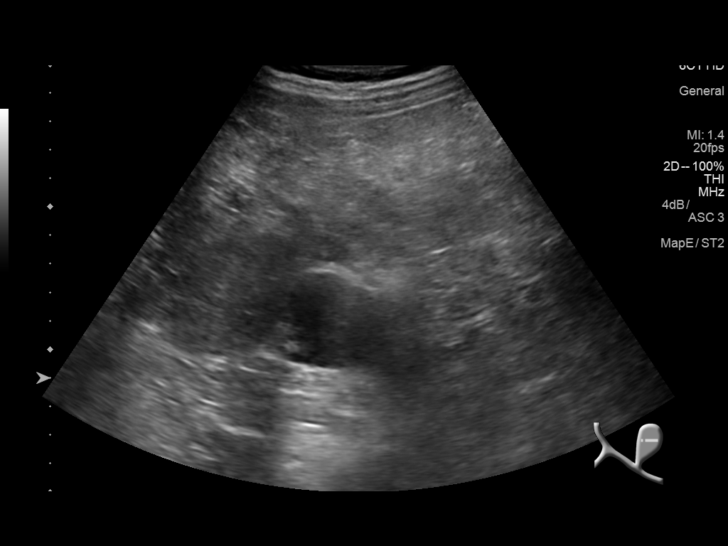
[im 20/43]
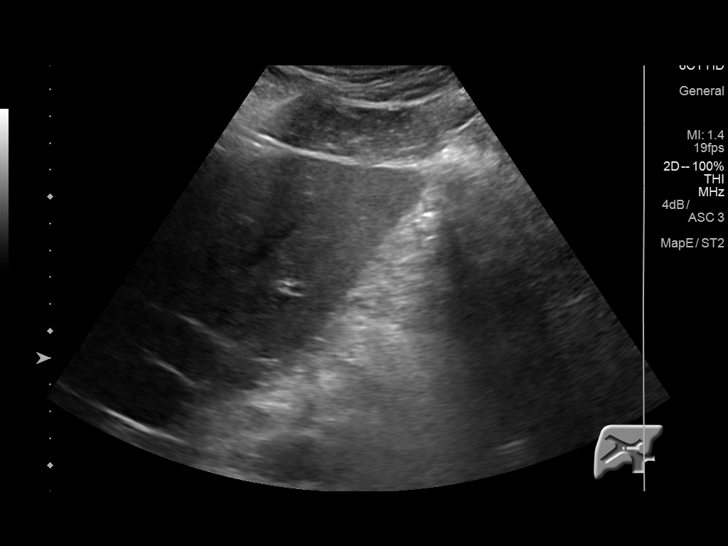
[im 23/43]
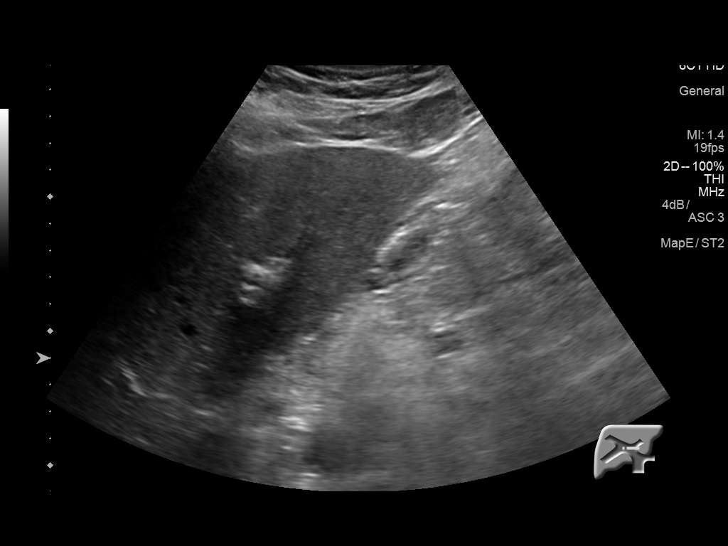
[im 27/43]
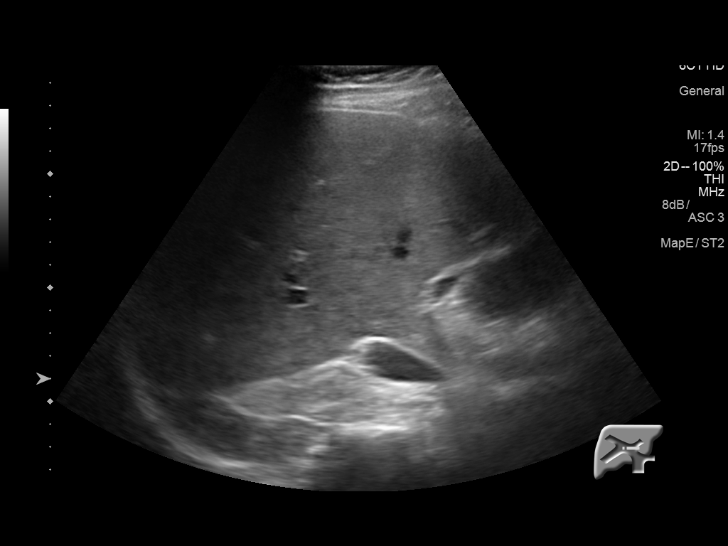
[im 29/43]
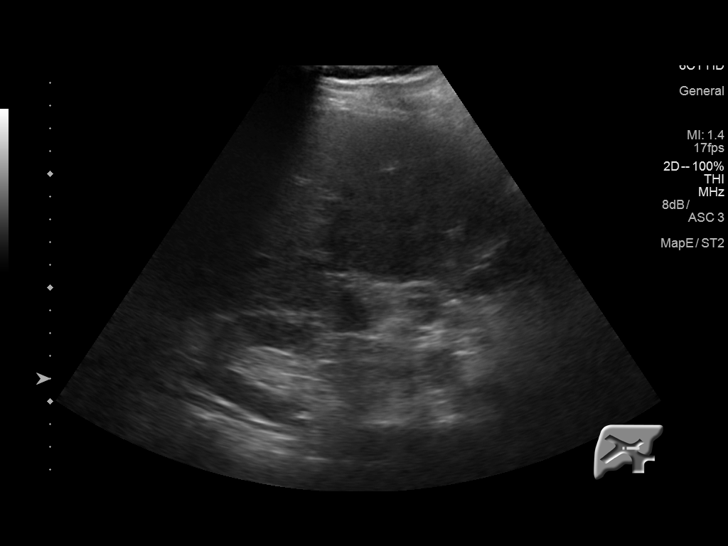
[im 32/43]
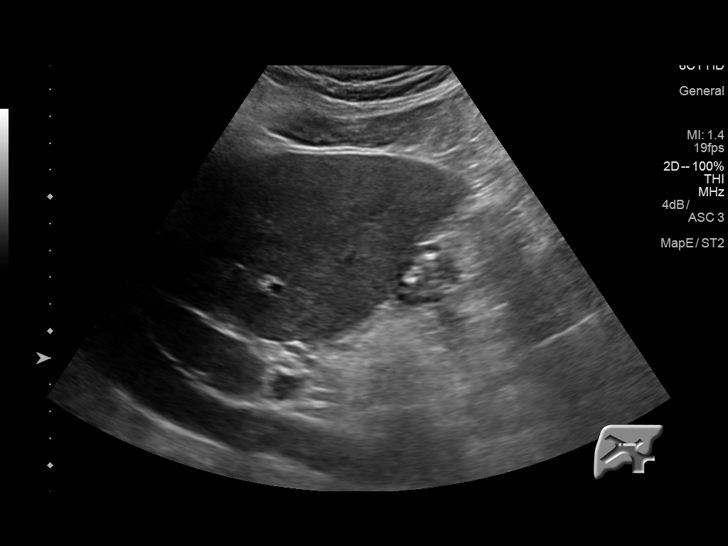
[im 36/43]
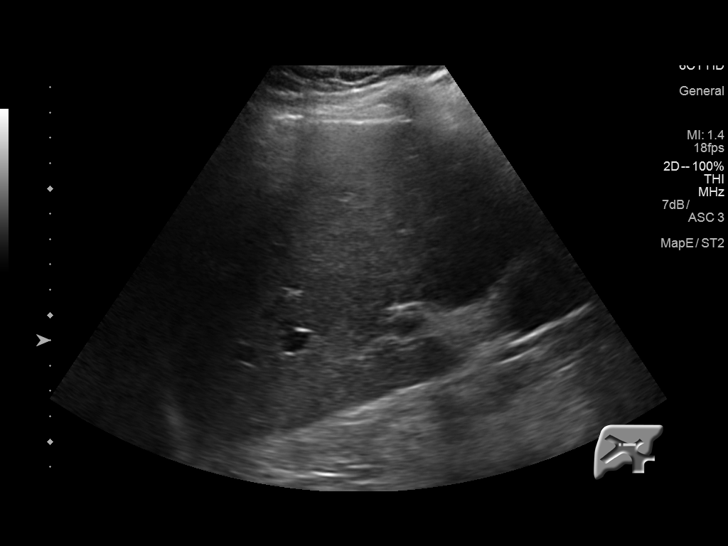
[im 39/43]
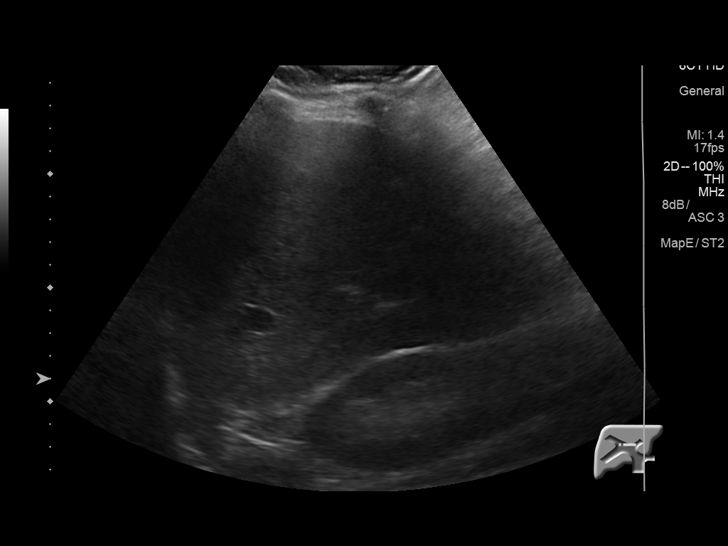
[im 43/43]
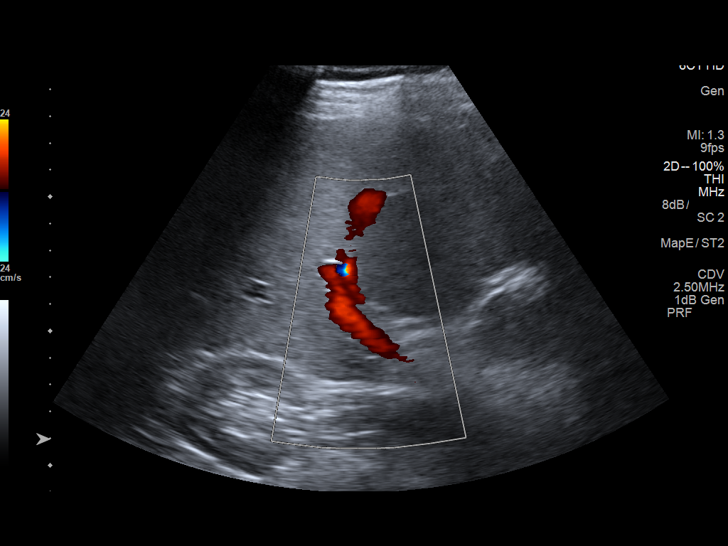

[14 of 25 positions shown; findings below may reference images not displayed]

FINDINGS: Gallbladder:

Small gallstones measure up to 12 mm. There is no gallbladder wall
thickening or pericholecystic fluid. Negative sonographic Murphy's
sign.

Common bile duct:

Diameter: 4 mm

Liver:

Mild increased liver echogenicity likely mild fatty infiltration.
Portal vein is patent on color Doppler imaging with normal direction
of blood flow towards the liver.
IMPRESSION: 1. Cholelithiasis without sonographic evidence of acute
cholecystitis.
2. Probable mild fatty liver.
3. Patent main portal vein with hepatopetal flow.

## 2021-10-15 ENCOUNTER — Other Ambulatory Visit: Payer: Self-pay

## 2021-10-15 ENCOUNTER — Encounter (HOSPITAL_COMMUNITY): Payer: Self-pay | Admitting: Student

## 2021-10-15 ENCOUNTER — Ambulatory Visit (HOSPITAL_COMMUNITY)
Admission: EM | Admit: 2021-10-15 | Discharge: 2021-10-16 | Disposition: A | Discharge status: Home / Self Care | Payer: Medicare Other | Attending: Psychiatry | Admitting: Psychiatry

## 2021-10-15 DIAGNOSIS — F0392 Unspecified dementia, unspecified severity, with psychotic disturbance: Secondary | ICD-10-CM | POA: Diagnosis not present

## 2021-10-15 DIAGNOSIS — E119 Type 2 diabetes mellitus without complications: Secondary | ICD-10-CM | POA: Insufficient documentation

## 2021-10-15 DIAGNOSIS — F209 Schizophrenia, unspecified: Secondary | ICD-10-CM | POA: Diagnosis not present

## 2021-10-15 DIAGNOSIS — Z20822 Contact with and (suspected) exposure to covid-19: Secondary | ICD-10-CM | POA: Diagnosis not present

## 2021-10-15 DIAGNOSIS — F259 Schizoaffective disorder, unspecified: Secondary | ICD-10-CM | POA: Diagnosis present

## 2021-10-15 DIAGNOSIS — F22 Delusional disorders: Secondary | ICD-10-CM | POA: Diagnosis present

## 2021-10-15 LAB — RESP PANEL BY RT-PCR (FLU A&B, COVID) ARPGX2
Influenza A by PCR: NEGATIVE
Influenza B by PCR: NEGATIVE
SARS Coronavirus 2 by RT PCR: NEGATIVE

## 2021-10-15 LAB — LIPID PANEL
Cholesterol: 229 mg/dL — ABNORMAL HIGH (ref 0–200)
HDL: 36 mg/dL — ABNORMAL LOW (ref >40)
LDL Cholesterol: 168 mg/dL — ABNORMAL HIGH (ref 0–99)
Total CHOL/HDL Ratio: 6.4 RATIO
Triglycerides: 127 mg/dL (ref <150)
VLDL: 25 mg/dL (ref 0–40)

## 2021-10-15 LAB — POCT URINE DRUG SCREEN - MANUAL ENTRY (I-SCREEN)
POC Amphetamine UR: NOT DETECTED
POC Buprenorphine (BUP): NOT DETECTED
POC Cocaine UR: NOT DETECTED
POC Marijuana UR: NOT DETECTED
POC Methadone UR: NOT DETECTED
POC Methamphetamine UR: NOT DETECTED
POC Morphine: NOT DETECTED
POC Oxazepam (BZO): POSITIVE — AB
POC Oxycodone UR: NOT DETECTED
POC Secobarbital (BAR): NOT DETECTED

## 2021-10-15 LAB — CBC WITH DIFFERENTIAL/PLATELET
Abs Immature Granulocytes: 0.01 10*3/uL (ref 0.00–0.07)
Basophils Absolute: 0.1 10*3/uL (ref 0.0–0.1)
Basophils Relative: 1 %
Eosinophils Absolute: 0.2 10*3/uL (ref 0.0–0.5)
Eosinophils Relative: 3 %
HCT: 39.1 % (ref 39.0–52.0)
Hemoglobin: 13 g/dL (ref 13.0–17.0)
Immature Granulocytes: 0 %
Lymphocytes Relative: 36 %
Lymphs Abs: 2.8 10*3/uL (ref 0.7–4.0)
MCH: 29.5 pg (ref 26.0–34.0)
MCHC: 33.2 g/dL (ref 30.0–36.0)
MCV: 88.9 fL (ref 80.0–100.0)
Monocytes Absolute: 0.6 10*3/uL (ref 0.1–1.0)
Monocytes Relative: 7 %
Neutro Abs: 4.3 10*3/uL (ref 1.7–7.7)
Neutrophils Relative %: 53 %
Platelets: 295 10*3/uL (ref 150–400)
RBC: 4.4 MIL/uL (ref 4.22–5.81)
RDW: 13 % (ref 11.5–15.5)
WBC: 8 10*3/uL (ref 4.0–10.5)
nRBC: 0 % (ref 0.0–0.2)

## 2021-10-15 LAB — TSH: TSH: 1.424 u[IU]/mL (ref 0.350–4.500)

## 2021-10-15 LAB — COMPREHENSIVE METABOLIC PANEL
ALT: 22 U/L (ref 0–44)
AST: 15 U/L (ref 15–41)
Albumin: 3.8 g/dL (ref 3.5–5.0)
Alkaline Phosphatase: 85 U/L (ref 38–126)
Anion gap: 7 (ref 5–15)
BUN: 17 mg/dL (ref 8–23)
CO2: 25 mmol/L (ref 22–32)
Calcium: 8.9 mg/dL (ref 8.9–10.3)
Chloride: 108 mmol/L (ref 98–111)
Creatinine, Ser: 0.89 mg/dL (ref 0.61–1.24)
GFR, Estimated: >60 mL/min (ref >60)
Glucose, Bld: 181 mg/dL — ABNORMAL HIGH (ref 70–99)
Potassium: 4.3 mmol/L (ref 3.5–5.1)
Sodium: 140 mmol/L (ref 135–145)
Total Bilirubin: 0.7 mg/dL (ref 0.3–1.2)
Total Protein: 6.2 g/dL — ABNORMAL LOW (ref 6.5–8.1)

## 2021-10-15 LAB — URINALYSIS, ROUTINE W REFLEX MICROSCOPIC
Bacteria, UA: NONE SEEN
Bilirubin Urine: NEGATIVE
Glucose, UA: 150 mg/dL — AB
Hgb urine dipstick: NEGATIVE
Ketones, ur: 5 mg/dL — AB
Nitrite: NEGATIVE
Protein, ur: NEGATIVE mg/dL
Specific Gravity, Urine: 1.028 (ref 1.005–1.030)
pH: 5 (ref 5.0–8.0)

## 2021-10-15 LAB — HEMOGLOBIN A1C
Hgb A1c MFr Bld: 9.9 % — ABNORMAL HIGH (ref 4.8–5.6)
Mean Plasma Glucose: 237.43 mg/dL

## 2021-10-15 LAB — GLUCOSE, CAPILLARY: Glucose-Capillary: 191 mg/dL — ABNORMAL HIGH (ref 70–99)

## 2021-10-15 LAB — POC SARS CORONAVIRUS 2 AG: SARSCOV2ONAVIRUS 2 AG: NEGATIVE

## 2021-10-15 MED ORDER — ONDANSETRON 4 MG PO TBDP
4.0000 mg | ORAL_TABLET | Freq: Four times a day (QID) | ORAL | Status: DC | PRN
Start: 2021-10-15 — End: 2021-10-16

## 2021-10-15 MED ORDER — RISPERIDONE 2 MG PO TABS
2.0000 mg | ORAL_TABLET | Freq: Every day | ORAL | Status: DC
  Administered 2021-10-15: 2 mg via ORAL
  Filled 2021-10-15: qty 1
  Filled 2021-10-15: qty 7

## 2021-10-15 MED ORDER — ACETAMINOPHEN 325 MG PO TABS: 650.0000 mg | ORAL_TABLET | Freq: Four times a day (QID) | ORAL | Status: DC | PRN

## 2021-10-15 MED ORDER — LORAZEPAM 1 MG PO TABS: 1.0000 mg | ORAL_TABLET | Freq: Four times a day (QID) | ORAL | Status: DC | PRN

## 2021-10-15 MED ORDER — LOPERAMIDE HCL 2 MG PO CAPS: 2.0000 mg | ORAL_CAPSULE | ORAL | Status: DC | PRN

## 2021-10-15 MED ORDER — MAGNESIUM HYDROXIDE 400 MG/5ML PO SUSP: 30.0000 mL | Freq: Every day | ORAL | Status: DC | PRN

## 2021-10-15 MED ORDER — INSULIN ASPART 100 UNIT/ML IJ SOLN
0.0000 [IU] | Freq: Three times a day (TID) | INTRAMUSCULAR | Status: DC
  Administered 2021-10-15: 2 [IU] via SUBCUTANEOUS
  Administered 2021-10-16: 5 [IU] via SUBCUTANEOUS

## 2021-10-15 MED ORDER — ADULT MULTIVITAMIN W/MINERALS CH
1.0000 | ORAL_TABLET | Freq: Every day | ORAL | Status: DC
  Administered 2021-10-15 – 2021-10-16 (×2): 1 via ORAL
  Filled 2021-10-15 (×2): qty 1

## 2021-10-15 MED ORDER — HYDROXYZINE HCL 25 MG PO TABS
25.0000 mg | ORAL_TABLET | Freq: Four times a day (QID) | ORAL | Status: DC | PRN
  Administered 2021-10-15: 25 mg via ORAL
  Filled 2021-10-15: qty 1

## 2021-10-15 MED ORDER — THIAMINE HCL 100 MG/ML IJ SOLN
100.0000 mg | Freq: Once | INTRAMUSCULAR | Status: AC
  Administered 2021-10-15: 100 mg via INTRAMUSCULAR
  Filled 2021-10-15: qty 2

## 2021-10-15 MED ORDER — ALUM & MAG HYDROXIDE-SIMETH 200-200-20 MG/5ML PO SUSP: 30.0000 mL | ORAL | Status: DC | PRN

## 2021-10-15 MED ORDER — THIAMINE HCL 100 MG PO TABS
100.0000 mg | ORAL_TABLET | Freq: Every day | ORAL | Status: DC
  Administered 2021-10-16: 100 mg via ORAL
  Filled 2021-10-15: qty 1

## 2021-10-15 NOTE — Discharge Instructions (Signed)
To see which pharmacy near you is the CHEAPEST for certain medications, please use GoodRx. It is free website and has a free phone app.      Also consider looking at [facility] $4[facility]00 or [facility] $7.00 prescription list. Both are free to view if googled "[facility] prescription" and "[facility] $7 prescription". These are set prices, no insurance required.   For issues with sleep, please use this free app for insomnia called CBT-I. Let your doctors and therapists know so they can help with extra tips and tricks or for guidance and accountability. NO ADDS on the app.     For therapy outside the hospital, please ask for these specific types of therapy: CBT   Please make regular appointments with an outpatient psychiatrist and other doctors once you leave the hospital.    Suicide hotline: 988 Emergency: 911 

## 2021-10-15 NOTE — ED Provider Notes (Signed)
[facility] Continuous Assessment Admission H&P  Date: 10/15/21 Patient Name: [name] MRN: [id number] Chief Complaint:  Chief Complaint  Patient presents with   Schizophrenia    Delusions that electronics are hypnotizing him      Diagnoses:  Final diagnoses:  Schizophrenia, unspecified type [facility])    HPI: [name] is a 71 y.o. male, with PPH unspecified schizophrenia, past psychiatric hospitalization, who presented voluntary to [facility] (10/15/2021) with friend Ellison Carwin  from home for evaluation of disorganized thought and paranoid delusions  Current stressors: Taking care of mom with dementia  Evaluation was limited due to disorganized thought process.  Patient was alert and oriented to month, year, city and state, situation.  Disoriented to specific day.  Was able to complete days of week backwards without difficulties.  Patient was brought in by his friend Raynelle Fanning, however he was not able to pinpoint and inciting event that led to presentation today.  Rather stated that he could use a prescription refill of his Klonopin, that he also provides to his mom to help her calm down.  Reported that mom has dementia, and he is her caretaker. Patient was tangential and stated that his problems started back in 2004 or 2008, where he was admitted to a psych hospital and started on Zyprexa for similar symptoms currently.  Stated that Zyprexa gave him diabetes and that he no longer trusts it because it is made by the same company that makes narcotics has killed multiple people.  Patient then jumped to talking about getting messages from multiple sources of electronics including his microwave, the TV, computers, telephone wires and attempt to control his mind or put thoughts into them.  Stated that some of them are from [place] who are trying to bully him.  To combat the ideas of reference per above, patient wears multiple layers of hats, that he reported his mind  with silver, to keep the "pings" out.  Also reported having to put various structures around his house to protect from these messages.  Patient was unable to report how long this has been happening for, he perseverated on his initial hospitalization in 2008. Patient reported symptoms of olfactory hallucinations, where he is smelling ammonia.  This happened last night.  Also reported that he has had a strange dream, involving the [employer] and nuclear attack, but he was unconcerned about.    However patient was unable to tell if he was having AVH or not.  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   Patient denied SI/HI today. Reported that his last active SI was multiple years ago.   However he has reported worsening depression given his mom's illness.   Patient reported that his sleep is generally good, although interrupted the past few nights due to the extremes. Reported that his appetite is appropriate and stable.  Patient gave verbal consent to reach out to his friend Raynelle Fanning for collateral.  Patient is also amenable to admission to inpatient psych.  Collateral: [name] which is patient's friend and dentists.  Stated that today, the patient called her asking her to bring him to [facility] because he had a bad night with his mom.  Friend was unable to give further detail, as patient would not tell her why.  Friend suspect it has something to do with his mother's new caretakers, age advocate. Age advocate at the home to give mom 24hr care. Friend reported that there is tension between patient and  the care takers as patient use to be mom's sole caretaker.  Friend reported that patient has had delusions about people in [place] sending him messages for years that was stable, until a few weeks ago. Where patient has neglected self grooming and stop taking care of home. Patient has not been eating as much as well.   Past Medical History:  Dx: [facility] Rx: Klonopin Outpatient psych: Denied Outpatient  therapy: Denied PCP: Unclear possibly [name] Suicide: Denied Inpatient psych: 2-3 times in the past  Substance Use:  EtOH: Denied Tobacco: Denied IVDU: Denied Opiates: Denied BZOs: Klonopin daily  Social History: Living: With mom  Family History: Mom has dementia Was not able to evaluate for further family history given disorganized thought  PHQ 2-9:     Total Time spent with patient: 1 hour  Musculoskeletal  Strength & Muscle Tone: within normal limits Gait & Station: normal Patient leans: Front  Psychiatric Specialty Exam  Presentation General Appearance: Bizarre; Fairly Groomed (Multiple layers of hats that is "silver lined")  Eye Contact:Good  Speech:Clear and Coherent; Normal Rate  Speech Volume:Normal  Handedness:Right   Mood and Affect  Mood:Anxious; Depressed  Affect:Restricted; Congruent; Depressed   Thought Process  Thought Processes:Disorganized  Descriptions of Associations:Loose  Orientation:Partial (Oriented to month, year, location.  Disoriented to specific day)  Thought Content:Delusions; Illogical; Paranoid Ideation; Perseveration; Rumination; Scattered; Tangential (Delusions that electronics are trying to control his mind, he is getting thoughts put in from various items from [place].)    Hallucinations:Hallucinations: Olfactory; Other (comment) (Olfactory hallucination, smelling ammonia.  Patient was unsure if he is having AVH)  Ideas of Reference:Delusions; Paranoia (Ideas of reference from microwave, phone lines, computers, TV screens.  Saying that they are trying to put thoughts into his mind, mind control him)  Suicidal Thoughts:Suicidal Thoughts: No  Homicidal Thoughts:Homicidal Thoughts: No   Sensorium  Memory:Immediate Good  Judgment:Fair  Insight:Shallow   Executive Functions  Concentration:Good  Attention Span:Good  Recall:Good  Fund of Knowledge:Good (Able to last 3 presidents  correctly)  Language:Good   Psychomotor Activity  Psychomotor Activity:Psychomotor Activity: Normal   Assets  Assets:Communication Skills; Desire for Improvement; Housing; Leisure Time; Resilience; Social Support   Sleep  Sleep:Sleep: Poor   Nutritional Assessment (For OBS and [facility] admissions only) Has the patient had a weight loss or gain of 10 pounds or more in the last 3 months?: No Has the patient had a decrease in food intake/or appetite?: No Does the patient have dental problems?: No Does the patient have eating habits or behaviors that may be indicators of an eating disorder including binging or inducing vomiting?: No Has the patient been eating poorly because of a decreased appetite?: 0    Physical Exam Vitals and nursing note reviewed.  HENT:     Head: Normocephalic and atraumatic.  Pulmonary:     Effort: Pulmonary effort is normal. No respiratory distress.  Neurological:     General: No focal deficit present.     Mental Status: He is alert.    Review of Systems  Respiratory:  Negative for shortness of breath.   Cardiovascular:  Negative for chest pain.  Gastrointestinal:  Negative for nausea and vomiting.  Neurological:  Positive for headaches. Negative for dizziness.    Blood pressure 110/68, pulse 92, temperature 98 F (36.7 C), temperature source Oral, resp. rate 18, SpO2 94 %. There is no height or weight on file to calculate BMI.  Past Psychiatric History: Per above   Is the  patient at risk to self? Yes  Has the patient been a risk to self in the past 6 months? No .    Has the patient been a risk to self within the distant past? Yes   Is the patient a risk to others? Yes   Has the patient been a risk to others in the past 6 months? No   Has the patient been a risk to others within the distant past? Yes   Past Medical History:  Past Medical History:  Diagnosis Date   Diabetes mellitus without complication (HCC)    No past surgical history on  file.  Family History:  Family History  Problem Relation Age of Onset   Stroke Mother    Cancer Father     Social History:  Social History   Socioeconomic History   Marital status: Single    Spouse name: Not on file   Number of children: Not on file   Years of education: Not on file   Highest education level: Not on file  Occupational History   Not on file  Tobacco Use   Smoking status: Never   Smokeless tobacco: Never  Vaping Use   Vaping Use: Never used  Substance and Sexual Activity   Alcohol use: Yes   Drug use: No   Sexual activity: Not on file  Other Topics Concern   Not on file  Social History Narrative   Not on file   Social Determinants of Health   Financial Resource Strain: Not on file  Food Insecurity: Not on file  Transportation Needs: Not on file  Physical Activity: Not on file  Stress: Not on file  Social Connections: Not on file  Intimate Partner Violence: Not on file    SDOH:  SDOH Screenings   Alcohol Screen: Not on file  Depression (PHQ2-9): Not on file  Financial Resource Strain: Not on file  Food Insecurity: Not on file  Housing: Not on file  Physical Activity: Not on file  Social Connections: Not on file  Stress: Not on file  Tobacco Use: Not on file  Transportation Needs: Not on file    Last Labs:  Admission on 10/15/2021  Component Date Value Ref Range Status   SARSCOV2ONAVIRUS 2 AG 10/15/2021 NEGATIVE  NEGATIVE Final   Comment: (NOTE) SARS-CoV-2 antigen NOT DETECTED.   Negative results are presumptive.  Negative results do not preclude SARS-CoV-2 infection and should not be used as the sole basis for treatment or other patient management decisions, including infection  control decisions, particularly in the presence of clinical signs and  symptoms consistent with COVID-19, or in those who have been in contact with the virus.  Negative results must be combined with clinical observations, patient history, and  epidemiological information. The expected result is Negative.  Fact Sheet for Patients: https://www.jennings-kim.com/  Fact Sheet for Healthcare Providers: https://alexander-rogers.biz/  This test is not yet approved or cleared by the [place] FDA and  has been authorized for detection and/or diagnosis of SARS-CoV-2 by FDA under an Emergency Use Authorization (EUA).  This EUA will remain in effect (meaning this test can be used) for the duration of  the COV                          ID-19 declaration under Section 564(b)(1) of the Act, 21 U.S.C. section 360bbb-3(b)(1), unless the authorization is terminated or revoked sooner.      Allergies: Patient has no known  allergies.  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  Patient is alert and oriented and attention intact, able to name the last 3 presidents- no evidence of delirium. Unclear of inciting event that led to his presentation, will contact collateral for further information. Currently patient is not suicidal nor homicidal.  Patient was amenable to voluntary admission to [facility] and then inpatient psych. [facility] admission obs Dispo to geriatric psych-appreciate CSW for assistance Routine psych labs CIWA per protocol Holding home Klonopin, we will consider Ativan if UDS is consistent with regular use to avoid withdrawal delirium Med rec per pharmacy, appreciate assistance Consider re-starting home risperdal 2 mg daily, pending EKG results  Diabetes on insulin Patient is unsure of amount. Med rec per pharmacy, plan to restart home insulin SSI - sensitive Repeat A1c Will consider continuing home med if friend is able to find home trulicity  Recommendations   Based on my evaluation the patient does not appear to have an emergency medical condition.  Princess Bruins, DO 10/15/21  3:55 PM

## 2021-10-15 NOTE — BH Assessment (Addendum)
Comprehensive Clinical Assessment (CCA) Note  10/15/2021 [name] [id number]  DISPOSITION: Per [name], pt is recommended for Gero-psych treatment.   The patient demonstrates the following risk factors for suicide: Chronic risk factors for suicide include: psychiatric disorder of schizophrenia . Acute risk factors for suicide include: social withdrawal/isolation. Protective factors for this patient include: positive social support, responsibility to others (children, family), and hope for the future. Considering these factors, the overall suicide risk at this point appears to be low. Patient is appropriate for outpatient follow up.   Pt is a 71 yo male who presented to the [facility] voluntarily brought in by a friend of his mother's, [name], upon his request. Pt stated he "needs a tune up" and wants to find some medications that work for him. HX of schizophrenia and pt stated that he stopped taking his prescribed medications about 1.5 years ago. Pt stated that his psychiatrist is in [place] ([name]) and he stated he saw his about 2 weeks ago when he was visiting [place]. Pt stated his doctor prescribed him some medications that he has been taking for about 2 weeks. Per [name], pt was talking to her about concerns he had for microwaves and electronics trying to control his brain and was wearing multiple hats to help with that. On a very hot, humid day, pt was wearing a thick knit ski cap that appeared to possibly have something underneath. Per [name] note, pt also made mention that he was concerned the electronic were trying to hypnotize him. Pt denied SI, any hx of suicide attempts, HI, NSSH and AVH. Pt did describe some "weird dreams" that he reported having had for years of [place] talking to him in a vision. He stated "You know, I have a psychic side.Marland KitchenMarland KitchenI have weird dreams." Pt also stated that he hears "unexplainable sounds" but did not give further details. Pt stated that his  biggest stressor currently is helping to care for his mother who he says has dementia. He stated that he once had many "artist friends" in [place] but now, they have all moved away. Pt denied any drug use although he stated he once "many years ago" used cannabis regularly. Pt stated currently he "drinks about a thimble full of alcohol on New Year's." Hx of IP psychiatric admissions in [place] "at [facility] years ago," and at [facility] in 2008 twice. Pt denied access to guns.  Pt stated he was focused on and worried by his mother's health. Pt stated that at times he has trouble sleeping due to his mother "crying out" at night. Pt stated that he and his mother were both eating well and regularly.  Pt stated he has never been married and until about 15 years ago, lived in [place]. Pt stated he has 1 adopted son who is now an adult and lives in [place]. Pt stated he earned a college degree and worked in Musician and print as an Surveyor, minerals in [place]. This does not appear to be delusional.   Pt was calm, cooperative, alert and appeared oriented. Pt did not appear to be responding to internal stimuli or to be intoxicated.  Pt's speech and movement appeared within normal limits and appearance was unremarkable aside from his manner of dress. Pt's mood seemed solemn but not depressed, and pt had a flat affect which was congruent. Pt did have a full range of expressions and chucked at times appropriately. Pt's judgment and insight seemed  within normal limits. Pt did described ongoing AVH that he stated he "has from time to time." Pt did express delusional thinking to [name] per her note. Hx of delusional thinking and AVH.   Chief Complaint:  Chief Complaint  Patient presents with   Schizophrenia    Delusions that electronics are hypnotizing him   Visit Diagnosis:  Schizophrenia    CCA Screening, Triage and Referral (STR)  Patient Reported Information How did you hear about [place]?  Family/Friend  What Is the Reason for Your Visit/Call Today? Pt is a 71 yo male who presented to the [facility] voluntarily brought in by a friend of his mother's, [name] [phone]), upon his request. Pt stated he "needs a tune up" and wants to find some medications that work for him. HX of schizophrenia and pt stated that he stopped taking his prescribed medications about 1.5 years ago. Pt stated that his psychiatrist is in [place] ([name]) and he stated he saw his about 2 weeks ago when he was visiting [place]. Pt stated his doctor prescribed him some medications that he has been taking for about 2 weeks. Per [name], pt was talking to her about concerns he had for microwaves and electronics trying to control his brain and was wearing multiple hats to help with that. On a very hot, humid day, pt was wearing a thick knit ski cap that appeared to possibly have something underneath. Per [name] note, pt also made mention that he was concerned the electronic were trying to hypnotize him. Pt denied SI, any hx of suicide attempts, HI, NSSH and AVH. Pt did describe some "weird dreams" that he reported having had for years of [name] talking to him in a vision. He stated "You know, I have a psychic side.Marland KitchenMarland KitchenI have weird dreams." Pt also stated that he hears "unexplainable sounds" but did not give further details. Pt stated that his biggest stressor currently is helping to care for his mother who he says has dementia. He stated that he once had many "artist friends" in [place] but now, they have all moved away. Pt denied any drug use although he stated he once "many years ago" used cannabis regularly. Pt stated currently he "drinks about a thimble full of alcohol on New Year's." Hx of IP psychiatric admissions in [place] "at [facility] years ago," and at [facility] in 2008 twice. Pt denied access to guns.  How Long Has This Been Causing You Problems? > than 6 months  What Do You Feel Would  Help You the Most Today? Medication(s)   Have You Recently Had Any Thoughts About Hurting Yourself? No  Are You Planning to Commit Suicide/Harm Yourself At This time? No   Have you Recently Had Thoughts About [place]? No  Are You Planning to Harm Someone at This Time? No  Explanation: No data recorded  Have You Used Any Alcohol or Drugs in the Past 24 Hours? No  How Long Ago Did You Use Drugs or Alcohol? No data recorded What Did You Use and How Much? No data recorded  Do You Currently Have a Therapist/Psychiatrist? Yes  Name of Therapist/Psychiatrist: Pt reported seeing a psychiatrist in [place] 2 weeks ago.   Have You Been Recently Discharged From Any Office Practice or Programs? No data recorded Explanation of Discharge From Practice/Program: No data recorded    CCA Screening Triage Referral Assessment Type of Contact: Face-to-Face  Telemedicine Service Delivery:   Is this Initial  or Reassessment? No data recorded Date Telepsych consult ordered in [facility]:  No data recorded Time Telepsych consult ordered in [facility]:  No data recorded Location of Assessment: [facility] Assessment Services  Provider Location: [facility] Assessment Services   Collateral Involvement: none   Does Patient Have a Automotive engineer Guardian? No data recorded Name and Contact of Legal Guardian: No data recorded If Minor and Not Living with Parent(s), Who has Custody? No data recorded Is CPS involved or ever been involved? -- (uta)  Is APS involved or ever been involved? -- Rich Reining)   Patient Determined To Be At Risk for Harm To Self or Others Based on Review of Patient Reported Information or Presenting Complaint? No data recorded Method: No data recorded Availability of Means: No data recorded Intent: No data recorded Notification Required: No data recorded Additional Information for Danger to Others Potential: No data recorded Additional Comments for Danger to Others Potential: No data  recorded Are There Guns or Other Weapons in Your Home? No data recorded Types of Guns/Weapons: No data recorded Are These Weapons Safely Secured?                            No data recorded Who Could Verify You Are Able To Have These Secured: No data recorded Do You Have any Outstanding Charges, Pending Court Dates, Parole/Probation? No data recorded Contacted To Inform of Risk of Harm To Self or Others: No data recorded   Does Patient Present under Involuntary Commitment? No  IVC Papers Initial File Date: No data recorded  [place] of Residence: [place]   Patient Currently Receiving the Following Services: No data recorded  Determination of Need: Emergent (2 hours) (Per Dr. Cyndie Chime, pt is recommended for Gero-psych treatment.)   Options For Referral: Inpatient Hospitalization     CCA Biopsychosocial Patient Reported Schizophrenia/Schizoaffective Diagnosis in Past: Yes (per chart)   Strengths: uta   Mental Health Symptoms Depression:   Change in energy/activity; Difficulty Concentrating   Duration of Depressive symptoms:  Duration of Depressive Symptoms: Less than two weeks   Mania:   None   Anxiety:    Worrying   Psychosis:   Delusions; Hallucinations   Duration of Psychotic symptoms:  Duration of Psychotic Symptoms: Greater than six months   Trauma:   None   Obsessions:   None   Compulsions:   None   Inattention:   None   Hyperactivity/Impulsivity:   None   Oppositional/Defiant Behaviors:   None   Emotional Irregularity:   None   Other Mood/Personality Symptoms:   uta    Mental Status Exam Appearance and self-care  Stature:   Average   Weight:   Overweight   Clothing:   Careless/inappropriate; Casual; Disheveled   Grooming:   Bizarre   Cosmetic use:   None   Posture/gait:   Normal   Motor activity:   Not Remarkable   Sensorium  Attention:   Distractible   Concentration:   Focuses on irrelevancies   Orientation:    X5   Recall/memory:   Normal   Affect and Mood  Affect:   Appropriate; Full Range   Mood:   Euthymic   Relating  Eye contact:   Normal   Facial expression:   Responsive   Attitude toward examiner:   Cooperative; Dramatic   Thought and Language  Speech flow:  Clear and Coherent; Normal   Thought content:   Appropriate to Mood and Circumstances  Preoccupation:   None   Hallucinations:   None (None currently per pt and pt did not appear to be responding to internal stimuli.)   Organization:  No data recorded  [employer] of Knowledge:   Average   Intelligence:   Average   Abstraction:   Functional   Judgement:   Fair   Reality Testing:   Distorted   Insight:   Lacking   Decision Making:   Confused   Social Functioning  Social Maturity:   -- Pincus Badder)   Social Judgement:   Normal   Stress  Stressors:   Illness (Pt stated he was focused on and worried by his mother's health.)   Coping Ability:   Overwhelmed; Exhausted   Skill Deficits:   -- Pincus Badder)   Supports:   Friends/Service system; Support needed     Religion: Religion/Spirituality Are You A Religious Person?:  Pincus Badder)  Leisure/Recreation: Leisure / Recreation Do You Have Hobbies?:  Pincus Badder)  Exercise/Diet: Exercise/Diet Do You Exercise?:  (uta) Have You Gained or Lost A Significant Amount of Weight in the Past Six Months?: No Do You Follow a Special Diet?: No Do You Have Any Trouble Sleeping?: Yes Explanation of Sleeping Difficulties: Pt stated that at times he has trouble sleeping due to his mother "crying out" at night.   CCA Employment/Education Employment/Work Situation: Employment / Work Technical sales engineer: Retired Has Patient ever [place]orla in Passenger transport manager?: No  Education: Education Is Patient Currently Attending School?: No Last Grade Completed: 31 (Pt stated he earned a college degree.) Did Physicist, medical?: Yes Did You Have An  Individualized Education Program (IIEP): No Did You Have Any Difficulty At [employer]?: Yes (bullies)   CCA Family/Childhood History Family and Relationship History: Family history Marital status: Single Does patient have children?: Yes How many children?: 1 (Pt stated he has 1 adopted son who is now an adult and lives in [place].)  Childhood History:  Childhood History By whom was/is the patient raised?:  (uta) Did patient suffer any verbal/emotional/physical/sexual abuse as a child?: No (none reported) Has patient ever been sexually abused/assaulted/raped as an adolescent or adult?: No (none reported) Witnessed domestic violence?: No  Child/Adolescent Assessment:     CCA Substance Use Alcohol/Drug Use: Alcohol / Drug Use Pain Medications: see MAR Prescriptions: see MAR Over the Counter: see MAR History of alcohol / drug use?: Yes Longest period of sobriety (when/how long): unknown Substance #1 Name of Substance 1: Hx of alcohol use- denies use now. Substance #2 Name of Substance 2: Hx of cannabis use- denies use now.                     ASAM's:  Six Dimensions of Multidimensional Assessment  Dimension 1:  Acute Intoxication and/or Withdrawal Potential:      Dimension 2:  Biomedical Conditions and Complications:      Dimension 3:  Emotional, Behavioral, or Cognitive Conditions and Complications:     Dimension 4:  Readiness to Change:     Dimension 5:  Relapse, Continued use, or Continued Problem Potential:     Dimension 6:  Recovery/Living Environment:     ASAM Severity Score:    ASAM Recommended Level of Treatment:     Substance use Disorder (SUD)    Recommendations for Services/Supports/Treatments:    Discharge Disposition:    DSM5 Diagnoses: Patient Active Problem List   Diagnosis Date Noted   Schizoaffective disorder ([place]) 10/15/2021   Schizophrenia ([place]) 10/15/2021  Referrals to Alternative Service(s): Referred to Alternative  Service(s):   Place:   Date:   Time:    Referred to Alternative Service(s):   Place:   Date:   Time:    Referred to Alternative Service(s):   Place:   Date:   Time:    Referred to Alternative Service(s):   Place:   Date:   Time:     Carolanne Grumbling, Counselor  Corrie Dandy T. Jimmye Norman, MS, [place], [facility] Triage Specialist [facility]

## 2021-10-15 NOTE — ED Notes (Signed)
Pt searched and skin assessment performed after covid test negative.  Pt was brought onto the unit and was oriented to unit and bed.   He denies SI, HI or AVH.  Pt reports that " I need my medication's adjusted".  Pt pleasant and redirectable.  Will monitor for safety.

## 2021-10-15 NOTE — ED Triage Notes (Signed)
Pt presents to [facility] requesting medication management. Pt states he has been caring for his mother who has dementia. Pt reports "weird dreams" but denies any hallucinations. Pt states that he would like a refill on his xanax.Pt denies SI/HI and AVH.

## 2021-10-15 NOTE — ED Notes (Signed)
Pt sleeping at present, no distress noted, respirations even & unlabored.  Monitoring for safety. ?

## 2021-10-15 NOTE — ED Notes (Signed)
Patient arrived on unit. Patient is cooperative and calm. Patient safe on unit with continued monitoring.

## 2021-10-15 NOTE — ED Notes (Signed)
Pt A&O x 4, no distress noted, resting at present. Calm & cooperative at present.  Monitoring for safety.

## 2021-10-16 DIAGNOSIS — F209 Schizophrenia, unspecified: Secondary | ICD-10-CM | POA: Diagnosis not present

## 2021-10-16 LAB — GLUCOSE, CAPILLARY: Glucose-Capillary: 258 mg/dL — ABNORMAL HIGH (ref 70–99)

## 2021-10-16 MED ORDER — ATORVASTATIN CALCIUM 10 MG PO TABS
20.0000 mg | ORAL_TABLET | Freq: Every day | ORAL | Status: DC
  Filled 2021-10-16: qty 14

## 2021-10-16 MED ORDER — HYDROXYZINE HCL 25 MG PO TABS
25.0000 mg | ORAL_TABLET | Freq: Every evening | ORAL | Status: DC | PRN
  Filled 2021-10-16: qty 7

## 2021-10-16 MED ORDER — HYDROXYZINE HCL 25 MG PO TABS: 25.0000 mg | ORAL_TABLET | Freq: Every evening | ORAL | 0 refills | Status: AC | PRN

## 2021-10-16 MED ORDER — ATORVASTATIN CALCIUM 20 MG PO TABS: 20.0000 mg | ORAL_TABLET | Freq: Every day | ORAL | 0 refills | Status: AC

## 2021-10-16 MED ORDER — RISPERIDONE 2 MG PO TABS: 2.0000 mg | ORAL_TABLET | Freq: Every day | ORAL | 0 refills | Status: AC

## 2021-10-16 NOTE — ED Notes (Signed)
Pt ambulatory to bathroom, gait slow and steady.  Monitoring for safety.

## 2021-10-16 NOTE — ED Provider Notes (Signed)
FBC/OBS ASAP Discharge Summary  Date and Time: 10/16/2021 10:13 AM  Name: [name]  MRN:  [id number]   Discharge Diagnoses:  Final diagnoses:  Schizophrenia, unspecified type [facility](HCC)    Subjective: [name] is a 71 y.o. male, with PPH delusional disorder, past psychiatric hospitalizations, who [facility] ([facility]) with friend [name] [phone] [name] from home for evaluation of disorganized thought and paranoid delusions in the setting of worsening depression.  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  Patient was alert and oriented to month, year, city and state, situation.  Disoriented to specific day.  Was able to complete days of week backwards without difficulties.   Patient was brought in by his friend [name], however he was not able to pinpoint and inciting event that led to presentation today.  Rather stated that he could use a prescription refill of his Klonopin, that he also provides to his mom to help her calm down.  Reported that mom has dementia, and he is her caretaker. Patient was tangential and stated that his problems started back in 2004 or 2008, where he was admitted to a psych hospital and started on Zyprexa for similar symptoms currently.  Stated that Zyprexa gave him diabetes and that he no longer trusts it because it is made by the same company that makes narcotics has killed multiple people.  Patient then jumped to talking about getting messages from multiple sources of electronics including his microwave, the TV, computers, telephone wires and attempt to control his mind or put thoughts into them.  Stated that some of them are from [place] who are trying to bully him.  To combat the ideas of reference per  above, patient wears multiple layers of hats, that he reported his mind with silver, to keep the "pings" out.  Also reported having to put various structures around his house to protect from these messages.  Patient was unable to report how long this has been happening for, he perseverated on his initial hospitalization in 2008. Patient reported symptoms of olfactory hallucinations, where he is smelling ammonia.  This happened last night.  Also reported that he has had a strange dream, involving the [place] [employer] and nuclear attack, but he was unconcerned about.     However patient was unable to tell if he was having AVH or not.  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    Patient denied SI/HI today. Reported that his last active SI was multiple years ago.   However he has reported worsening depression given his mom's illness.    Patient reported that his sleep is generally good, although interrupted the past few nights due to the extremes. Reported that his appetite is appropriate and stable.   Patient gave verbal consent to reach out to his friend [name] for collateral.  Patient is also amenable to admission to inpatient psych.   Collateral: [name] which is patient's friend and dentists.  Stated that today, the patient called her asking her to [facility] because he had a bad night with his mom.  Friend was unable to give further detail, as patient would not tell her why.  Friend suspect it has something to do with his mother's new caretakers, age advocate. Age advocate  at the home to give mom 24hr care. Friend reported that there is tension between patient and the care takers as patient use to be mom's sole caretaker.  Friend reported that patient has had delusions about people in [place] sending him messages for years that was stable, until a few weeks ago. Where patient has neglected self grooming and stop taking care of home. Patient has not been eating as much as well.    Past Medical  History:  Dx: [facility] Rx: Klonopin Outpatient psych: Denied Outpatient therapy: Denied PCP: Unclear possibly Raymond Litter Suicide: Denied Inpatient psych: 2-3 times in the past   Substance Use:  EtOH: Denied Tobacco: Denied IVDU: Denied Opiates: Denied BZOs: Klonopin daily   Social History: Living: With mom   Family History: Mom has dementia Was not able to evaluate for further family history given disorganized thought "  Stay Summary:  Patient was admitted to [facility] observations overnight. Behavior: pleasant, cooperative, engaged, adherent to scheduled medications, no agitation PRNs.  Restarted home risperdal 2mg  qHS, which helped patient's anxiety and sleep, some with the severity of the delusions. Patient reported inconsistent adherence to home risperdal. Work up showed notibly for A1c of 9.9, which I instructed patient and family to follow -up with PCP within 1 week for medication optimization. Patient was discharged home the next day.  With prescriptions of Risperdal 2 mg qHS and resources for outpatient psychiatry and outpatient therapy.  Patient narrative on day of dc:  Patient reported that his sleep was much improved here at [facility], stated that his mood was "good", appetite is stable.  Reported that since restarting his home Risperdal, that he has not been taking regularly, patient feels more calm and is less concerned about his delusions.  Reported that his delusions are at baseline, they are just mildly annoying.  Patient reported that he is ready to go home, and that if he were to have thoughts of SI/HI/AVH, worsening delusions or paranoia, that he would call his friends and return to [facility].  Instructed patient the importance of continuing his Risperdal at home.  Today patient denied SI/HI/AVH, paranoia.  Reported baseline delusions. The patient was able to contract to safety.  Total Time spent with patient: 1 hour  Past Psychiatric History: Per above Past Medical History:   Past Medical History:  Diagnosis Date   Diabetes mellitus without complication (HCC)    History reviewed. No pertinent surgical history. Family History:  Family History  Problem Relation Age of Onset   Stroke Mother    Cancer Father    Family Psychiatric History: Per above Social History:  Social History   Substance and Sexual Activity  Alcohol Use Yes     Social History   Substance and Sexual Activity  Drug Use No    Social History   Socioeconomic History   Marital status: Single    Spouse name: Not on file   Number of children: Not on file   Years of education: Not on file   Highest education level: Not on file  Occupational History   Not on file  Tobacco Use   Smoking status: Never   Smokeless tobacco: Never  Vaping Use   Vaping Use: Never used  Substance and Sexual Activity   Alcohol use: Yes   Drug use: No   Sexual activity: Not on file  Other Topics Concern   Not on file  Social History Narrative   Not on file   Social Determinants of Health  Financial Resource Strain: Not on file  Food Insecurity: Not on file  Transportation Needs: Not on file  Physical Activity: Not on file  Stress: Not on file  Social Connections: Not on file   SDOH:  SDOH Screenings   Alcohol Screen: Not on file  Depression (PHQ2-9): Medium Risk (10/15/2021)   Depression (PHQ2-9)    PHQ-2 Score: 7  Financial Resource Strain: Not on file  Food Insecurity: Not on file  Housing: Not on file  Physical Activity: Not on file  Social Connections: Not on file  Stress: Not on file  Tobacco Use: Low Risk  (10/15/2021)   Patient History    Smoking Tobacco Use: Never    Smokeless Tobacco Use: Never    Passive Exposure: Not on file  Transportation Needs: Not on file    Tobacco Cessation:  N/A, patient does not currently use tobacco products  Current Medications:  Current Facility-Administered Medications  Medication Dose Route Frequency Provider Last Rate Last Admin    acetaminophen (TYLENOL) tablet 650 mg  650 mg Oral Q6H PRN Princess Bruins, DO       alum & mag hydroxide-simeth (MAALOX/MYLANTA) 200-200-20 MG/5ML suspension 30 mL  30 mL Oral Q4H PRN Princess Bruins, DO       hydrOXYzine (ATARAX) tablet 25 mg  25 mg Oral Q6H PRN Princess Bruins, DO   25 mg at 10/15/21 2130   insulin aspart (novoLOG) injection 0-9 Units  0-9 Units Subcutaneous TID WC Princess Bruins, DO   5 Units at 10/16/21 0913   loperamide (IMODIUM) capsule 2-4 mg  2-4 mg Oral PRN Princess Bruins, DO       LORazepam (ATIVAN) tablet 1 mg  1 mg Oral Q6H PRN Princess Bruins, DO       magnesium hydroxide (MILK OF MAGNESIA) suspension 30 mL  30 mL Oral Daily PRN Princess Bruins, DO       multivitamin with minerals tablet 1 tablet  1 tablet Oral Daily Princess Bruins, DO   1 tablet at 10/16/21 0914   ondansetron (ZOFRAN-ODT) disintegrating tablet 4 mg  4 mg Oral Q6H PRN Princess Bruins, DO       risperiDONE (RISPERDAL) tablet 2 mg  2 mg Oral QHS Princess Bruins, DO   2 mg at 10/15/21 2130   thiamine tablet 100 mg  100 mg Oral Daily Princess Bruins, DO   100 mg at 10/16/21 3716   Current Outpatient Medications  Medication Sig Dispense Refill   atorvastatin (LIPITOR) 20 MG tablet Take 1 tablet (20 mg total) by mouth daily. Says has not been taking 30 tablet 0   Choline Fenofibrate (FENOFIBRIC ACID) 135 MG CPDR Take 135 mg by mouth daily.     gabapentin (NEURONTIN) 300 MG capsule Take 600 mg by mouth at bedtime.     hydrOXYzine (ATARAX) 25 MG tablet Take 1 tablet (25 mg total) by mouth at bedtime as needed. 30 tablet 0   NOVOLIN 70/30 (70-30) 100 UNIT/ML injection Inject 40 Units into the skin daily before breakfast. He says he takes 40 units daily (20 units per syringe at same time?)     risperiDONE (RISPERDAL) 2 MG tablet Take 1 tablet (2 mg total) by mouth at bedtime. 30 tablet 0   TRULICITY 1.5 MG/0.5ML SOPN Inject 1.5 mg into the skin every 7 (seven) days. He says he has lost this in his house.      PTA  Medications: (Not in a hospital admission)      10/15/2021  4:44 PM  Depression screen PHQ 2/9  Decreased Interest 1  Down, Depressed, Hopeless 1  PHQ - 2 Score 2  Altered sleeping 2  Tired, decreased energy 1  Change in appetite 0  Feeling bad or failure about yourself  1  Trouble concentrating 1  Moving slowly or fidgety/restless 0  Suicidal thoughts 0  PHQ-9 Score 7    Flowsheet Row ED from [facility]15/2021 [facility]  C-SSRS RISK CATEGORY No Risk       Musculoskeletal  Strength & Muscle Tone: within normal limits Gait & Station: normal Patient leans: N/A  Psychiatric Specialty Exam  Presentation  General Appearance: Appropriate for Environment; Disheveled  Eye Contact:Fair  Speech:Clear and Coherent; Normal Rate (Spontaneous)  Speech Volume:Normal  Handedness:Right   Mood and Affect  Mood:Euthymic  Affect:Appropriate; Congruent; Constricted   Thought Process  Thought Processes:Coherent; Goal Directed; Linear  Descriptions of Associations:Intact  Orientation:Full (Time, Place and Person)  Thought Content:Delusions (Delusion of electronics "whapping" him - wireless messages physically hitting him originating from [place]. at baseline.)  Diagnosis of Schizophrenia or Schizoaffective disorder in past: Yes  Duration of Psychotic Symptoms: Greater than six months   Hallucinations:Hallucinations: None  Ideas of Reference:Delusions [employer]" from electronics. no specific messages currently.  No commands.  Not hyperreligious.  Not paranoid.  Currently at baseline.)  Suicidal Thoughts:Suicidal Thoughts: No  Homicidal Thoughts:Homicidal Thoughts: No   Sensorium  Memory:Immediate Good  Judgment:Good  Insight:Fair   Executive Functions  Concentration:Good  Attention Span:Good  Recall:Good  Fund of Knowledge:Good  Language:Good   Psychomotor Activity  Psychomotor Activity:Psychomotor Activity: Normal   Assets   Assets:Communication Skills; Housing; Health and safety inspector; Desire for Improvement; Leisure Time; Resilience; Social Support; Talents/Skills; Vocational/Educational   Sleep  Sleep:Sleep: Fair   Nutritional Assessment (For OBS and FBC admissions only) Has the patient had a weight loss or gain of 10 pounds or more in the last 3 months?: No Has the patient had a decrease in food intake/or appetite?: No Does the patient have dental problems?: No Does the patient have eating habits or behaviors that may be indicators of an eating disorder including binging or inducing vomiting?: No Has the patient been eating poorly because of a decreased appetite?: 0    Physical Exam  Physical Exam Vitals and nursing note reviewed.  HENT:     Head: Normocephalic and atraumatic.  Pulmonary:     Effort: Pulmonary effort is normal. No respiratory distress.  Neurological:     General: No focal deficit present.     Mental Status: He is alert and oriented to person, place, and time.    Review of Systems  Respiratory:  Negative for shortness of breath.   Cardiovascular:  Negative for chest pain.  Gastrointestinal:  Negative for nausea and vomiting.  Genitourinary:  Negative for dysuria, frequency, hematuria and urgency.  Neurological:  Positive for headaches. Negative for dizziness, tremors, seizures and weakness.   Blood pressure 116/68, pulse 84, temperature (!) 97.4 F (36.3 C), temperature source Oral, resp. rate 20, SpO2 96 %. There is no height or weight on file to calculate BMI.  Demographic Factors:  Age 47 or older, Caucasian, and Unemployed  Loss Factors: NA  Historical Factors: Anniversary of important loss and Impulsivity  Risk Reduction Factors:   Sense of responsibility to family, Religious beliefs about death, Living with another person, especially a relative, Positive social support, Positive therapeutic relationship, and Positive coping skills or problem solving  skills  Continued Clinical Symptoms:  Depression:   Delusional Insomnia  Cognitive Features That Contribute To Risk:  None    Suicide Risk:  Mild:  Suicidal ideation of limited frequency, intensity, duration, and specificity.  There are no identifiable plans, no associated intent, mild dysphoria and related symptoms, good self-control (both objective and subjective assessment), few other risk factors, and identifiable protective factors, including available and accessible social support.  Plan Of Care/Follow-up recommendations:  Activity and diet at tolerated. See patient discharge instruction for further details.     Discharge Instructions      Dear Riley Lam,  Thank you for letting my team treat you, and I'm glad you're feeling better.   Please call and make an appointment with your PCP and talk about your diabetes and optimizing your medicines. Please note and tell your PCP that your A1c is 9.9 when we checked.  Please see numbers below to make an appointment for a psychiatrist and therapist. They will help with your anxiety, depression, and sleep.   ___________________________________________________  To see which pharmacy near you is the CHEAPEST for certain medications, please use GoodRx. It is free website and has a free phone app.      Also consider looking at [facility] $4.00 or Public[facility] $7.00 prescription list. Both are free to view if googled [facility]" and "[facility] $7 prescription". These are set prices, no insurance required.   For issues with sleep, please use this free app for insomnia called CBT-I. Let your doctors and therapists know so they can help with extra tips and tricks or for guidance and accountability. NO ADDS on the app.     For therapy outside the hospital, please ask for these specific types of therapy: CBT   Please make regular appointments with an outpatient psychiatrist and other doctors once you leave the hospital.    Suicide  hotline: 988 Emergency: 911  Base on the information you have provided and the presenting issue, outpatient services with therapy and psychiatry have been recommended.  It is imperative that you follow through with treatment recommendations within 5-7 days from the of discharge to mitigate further risk to your safety and mental well-being. A list of referrals have been provided below to get you started.  You are not limited to the list provided.  In case of an urgent crisis, you may contact the Mobile Crisis Unit with [facility] at [phone].  Outpatient Therapy and Psychiatry for Medicare Recipients  [facility] [address] [place], [place], [address] [phone] phone  [facility] [address], [address] [place], [place], [address] [phone] phone  [facility] [address], [address] [place], [place], [address] [phone] phone  [facility] [address] [place], [place], [address] [phone] phone [phone] fax  [facility] [place] [address], [place], [address] [phone] phone  [facility] [address] [place], [place], [address] [phone] phone [phone] fax  [facility] [address] [place], [place], [address] [phone] phone  [facility] [address] [place], [place], [address] [phone] phone  [facility] [address] [place], [place], [address] [phone] phone [phone] fax      Disposition: Home  Princess Bruins, DO 10/16/2021, 10:13 AM

## 2021-10-16 NOTE — ED Notes (Signed)
Patient discharged.

## 2021-10-16 NOTE — BH Assessment (Signed)
LCSW Progress Note   Per Princess Bruins, DO, this pt does not require psychiatric hospitalization at this time.  Pt is psychiatrically cleared.  Discharge instructions include several resources for outpatient therapy and medication management along with the mobile crisis number.  EDP Princess Bruins has been notified.  Hansel Starling, MSW, LCSW [facility] [phone] or [phone]

## 2021-10-16 NOTE — Progress Notes (Signed)
[facility] Inpatient Behavioral Health Placement  Meets inpatient criteria per ,Dr. Cyndie Chime.  Referral was sent to the following facilities;   Destination Service Provider Address Phone Fax  [facility]  [address] [place] [address] [phone]  [facility]  [address] [place] [address] [phone]  [facility] [address]  [address] [place] [address] [phone]  [facility]  [address] [place], [place] [address] [phone]  [facility]  [address] [place] [address] [phone]  [facility]  [address] [place], [place] [address] [phone]  [facility]  [address] [place] [address] [phone]  [facility]  [address], [place] [address] [phone]  [facility]  [address], [place] [address] [phone]  [facility]  [address] [place] [address] [phone]  [facility]  [address], [place] [address] [phone]  [facility]  [address] [place] [address] [phone]  [facility]  [address], [place] [address] [phone]  [facility]  [address] [place] [address] [phone]    Situation ongoing,  CSW will follow up.   Maryjean Ka, MSW, LCSWA 10/16/2021  @ 12:06 AM;

## 2021-10-16 NOTE — ED Notes (Addendum)
Pt is awake and alert.  Pt reports that he slept " a few hours"  Affect sleepy and flat but mood brightens and is pleasant.   Pt denies SI, HI or AVH.  Pt asked if this staff person thought he might be "allowed to go home this morning".  Pt was informed that providers will make their rounds shortly and speak with him.  Pt's CBG is in the 250's.  Pt asked for some eggs but was informed that the only choice was cereal or muffin.     Pt declined food at this time.   Will continue to monitor and reassess while waiting for Provider's rounds.

## 2021-11-05 ENCOUNTER — Telehealth (HOSPITAL_COMMUNITY): Payer: Self-pay | Admitting: General Practice

## 2021-11-05 NOTE — BH Assessment (Signed)
Care Management - [facility] Follow Up Discharges   Writer made contact.  Patient reports that he was able to follow up with a provider and does not need any assistance.

## 2022-02-09 ENCOUNTER — Other Ambulatory Visit: Payer: Self-pay

## 2022-02-09 ENCOUNTER — Encounter (HOSPITAL_COMMUNITY): Payer: Self-pay

## 2022-02-09 ENCOUNTER — Emergency Department (HOSPITAL_COMMUNITY)
Admission: EM | Admit: 2022-02-09 | Discharge: 2022-02-09 | Disposition: A | Discharge status: Home / Self Care | Payer: Medicare Other | Attending: Emergency Medicine | Admitting: Emergency Medicine

## 2022-02-09 DIAGNOSIS — Z794 Long term (current) use of insulin: Secondary | ICD-10-CM | POA: Diagnosis not present

## 2022-02-09 DIAGNOSIS — E119 Type 2 diabetes mellitus without complications: Secondary | ICD-10-CM | POA: Diagnosis not present

## 2022-02-09 DIAGNOSIS — Z8639 Personal history of other endocrine, nutritional and metabolic disease: Secondary | ICD-10-CM

## 2022-02-09 DIAGNOSIS — Z23 Encounter for immunization: Secondary | ICD-10-CM | POA: Insufficient documentation

## 2022-02-09 DIAGNOSIS — Z7984 Long term (current) use of oral hypoglycemic drugs: Secondary | ICD-10-CM | POA: Diagnosis not present

## 2022-02-09 DIAGNOSIS — W268XXA Contact with other sharp object(s), not elsewhere classified, initial encounter: Secondary | ICD-10-CM | POA: Insufficient documentation

## 2022-02-09 DIAGNOSIS — L03031 Cellulitis of right toe: Secondary | ICD-10-CM | POA: Diagnosis not present

## 2022-02-09 DIAGNOSIS — S91104A Unspecified open wound of right lesser toe(s) without damage to nail, initial encounter: Secondary | ICD-10-CM | POA: Insufficient documentation

## 2022-02-09 DIAGNOSIS — S91109A Unspecified open wound of unspecified toe(s) without damage to nail, initial encounter: Secondary | ICD-10-CM

## 2022-02-09 LAB — BASIC METABOLIC PANEL
Anion gap: 8 (ref 5–15)
BUN: 18 mg/dL (ref 8–23)
CO2: 24 mmol/L (ref 22–32)
Calcium: 9 mg/dL (ref 8.9–10.3)
Chloride: 110 mmol/L (ref 98–111)
Creatinine, Ser: 0.85 mg/dL (ref 0.61–1.24)
GFR, Estimated: >60 mL/min (ref >60)
Glucose, Bld: 121 mg/dL — ABNORMAL HIGH (ref 70–99)
Potassium: 4.3 mmol/L (ref 3.5–5.1)
Sodium: 142 mmol/L (ref 135–145)

## 2022-02-09 LAB — CBC WITH DIFFERENTIAL/PLATELET
Abs Immature Granulocytes: 0.02 10*3/uL (ref 0.00–0.07)
Basophils Absolute: 0.1 10*3/uL (ref 0.0–0.1)
Basophils Relative: 1 %
Eosinophils Absolute: 0.3 10*3/uL (ref 0.0–0.5)
Eosinophils Relative: 4 %
HCT: 39.8 % (ref 39.0–52.0)
Hemoglobin: 12.6 g/dL — ABNORMAL LOW (ref 13.0–17.0)
Immature Granulocytes: 0 %
Lymphocytes Relative: 26 %
Lymphs Abs: 1.9 10*3/uL (ref 0.7–4.0)
MCH: 28.8 pg (ref 26.0–34.0)
MCHC: 31.7 g/dL (ref 30.0–36.0)
MCV: 91.1 fL (ref 80.0–100.0)
Monocytes Absolute: 0.6 10*3/uL (ref 0.1–1.0)
Monocytes Relative: 8 %
Neutro Abs: 4.6 10*3/uL (ref 1.7–7.7)
Neutrophils Relative %: 61 %
Platelets: 357 10*3/uL (ref 150–400)
RBC: 4.37 MIL/uL (ref 4.22–5.81)
RDW: 13.2 % (ref 11.5–15.5)
WBC: 7.3 10*3/uL (ref 4.0–10.5)
nRBC: 0 % (ref 0.0–0.2)

## 2022-02-09 MED ORDER — AMOXICILLIN-POT CLAVULANATE 875-125 MG PO TABS: 1.0000 | ORAL_TABLET | Freq: Two times a day (BID) | ORAL | 0 refills | Status: DC

## 2022-02-09 MED ORDER — AMOXICILLIN-POT CLAVULANATE 875-125 MG PO TABS
1.0000 | ORAL_TABLET | Freq: Once | ORAL | Status: AC
  Administered 2022-02-09: 1 via ORAL
  Filled 2022-02-09: qty 1

## 2022-02-09 MED ORDER — TETANUS-DIPHTH-ACELL PERTUSSIS 5-2.5-18.5 LF-MCG/0.5 IM SUSY
0.5000 mL | PREFILLED_SYRINGE | Freq: Once | INTRAMUSCULAR | Status: AC
  Administered 2022-02-09: 0.5 mL via INTRAMUSCULAR
  Filled 2022-02-09: qty 0.5

## 2022-02-09 NOTE — ED Provider Notes (Signed)
[facility] DEPT Provider Note   CSN: [id number] Arrival date & time: 02/09/22  1206     History  Chief Complaint  Patient presents with   Toe Pain    [name] is a 71 y.o. male.  Pt c/o accidentally cutting tip of right third toe ~ 1 week ago. Pt indicates was trying to trim nail but accidentally trimmed distal tip/skin of toe. Now with redness to toe. No redness extending up foot. No fever or chills/sweats. Indicates otherwise does not feel sick or ill. No nv. No faintness or dizziness. Normal appetite. Last tetanus unknown.   The history is provided by the patient and medical records.  Toe Pain Pertinent negatives include no chest pain, no abdominal pain and no shortness of breath.       Home Medications Prior to Admission medications   Medication Sig Start Date End Date Taking? Authorizing Provider  atorvastatin (LIPITOR) 20 MG tablet Take 1 tablet (20 mg total) by mouth daily. Says has not been taking 10/16/21 11/15/21  Merrily Brittle, DO  Choline Fenofibrate (FENOFIBRIC ACID) 135 MG CPDR Take 135 mg by mouth daily. 05/17/21   [provider]  gabapentin (NEURONTIN) 300 MG capsule Take 600 mg by mouth at bedtime. 06/13/21   [provider]  NOVOLIN 70/30 (70-30) 100 UNIT/ML injection Inject 40 Units into the skin daily before breakfast. He says he takes 40 units daily (20 units per syringe at same time?) 05/18/21   [provider]  risperiDONE (RISPERDAL) 2 MG tablet Take 1 tablet (2 mg total) by mouth at bedtime. 10/16/21 11/15/21  Merrily Brittle, DO  TRULICITY 1.5 0000000 SOPN Inject 1.5 mg into the skin every 7 (seven) days. He says he has lost this in his house. 09/21/21   [provider]      Allergies    Patient has no known allergies.    Review of Systems   Review of Systems  Constitutional:  Negative for chills, diaphoresis and fever.  HENT:  Negative for sore throat.   Respiratory:  Negative for  shortness of breath.   Cardiovascular:  Negative for chest pain.  Gastrointestinal:  Negative for abdominal pain, nausea and vomiting.  Genitourinary:  Negative for dysuria.  Musculoskeletal:        Toe redness/soreness.   Skin:  Positive for wound.  Neurological:  Negative for weakness, light-headedness and numbness.    Physical Exam Updated Vital Signs BP (!) 97/58 (BP Location: Right Arm)   Pulse 97   Temp 97.8 F (36.6 C) (Oral)   Resp 16   SpO2 97%  Physical Exam Vitals and nursing note reviewed.  Constitutional:      Appearance: Normal appearance. He is well-developed.  HENT:     Head: Atraumatic.     Nose: Nose normal.     Mouth/Throat:     Mouth: Mucous membranes are moist.  Eyes:     General: No scleral icterus.    Pupils: Pupils are equal, round, and reactive to light.  Neck:     Trachea: No tracheal deviation.  Cardiovascular:     Rate and Rhythm: Normal rate.     Pulses: Normal pulses.  Pulmonary:     Effort: Pulmonary effort is normal. No accessory muscle usage or respiratory distress.  Musculoskeletal:     Cervical back: Neck supple.     Comments: Approximately 3-4 mm diameter circular superficial wound distal tip of right 3rd toe. No purulent drainage. No malodor. No necrotic  or devitalized tissue. 3rd toe is erythematous. Normal cap refill. No erythema or streaking up toe or leg.   Skin:    General: Skin is warm and dry.     Findings: No rash.  Neurological:     Mental Status: He is alert.     Comments: Alert, speech clear. Right foot nvi.   Psychiatric:        Mood and Affect: Mood normal.     ED Results / Procedures / Treatments   Labs (all labs ordered are listed, but only abnormal results are displayed) Results for orders placed or performed during the hospital encounter of [id number]  Basic metabolic panel  Result Value Ref Range   Sodium 142 135 - 145 mmol/L   Potassium 4.3 3.5 - 5.1 mmol/L   Chloride 110 98 - 111 mmol/L   CO2 24 22 - 32  mmol/L   Glucose, Bld 121 (H) 70 - 99 mg/dL   BUN 18 8 - 23 mg/dL   Creatinine, Ser 0.85 0.61 - 1.24 mg/dL   Calcium 9.0 8.9 - 10.3 mg/dL   GFR, Estimated >60 >60 mL/min   Anion gap 8 5 - 15  CBC with Differential  Result Value Ref Range   WBC 7.3 4.0 - 10.5 K/uL   RBC 4.37 4.22 - 5.81 MIL/uL   Hemoglobin 12.6 (L) 13.0 - 17.0 g/dL   HCT 39.8 39.0 - 52.0 %   MCV 91.1 80.0 - 100.0 fL   MCH 28.8 26.0 - 34.0 pg   MCHC 31.7 30.0 - 36.0 g/dL   RDW 13.2 11.5 - 15.5 %   Platelets 357 150 - 400 K/uL   nRBC 0.0 0.0 - 0.2 %   Neutrophils Relative % 61 %   Neutro Abs 4.6 1.7 - 7.7 K/uL   Lymphocytes Relative 26 %   Lymphs Abs 1.9 0.7 - 4.0 K/uL   Monocytes Relative 8 %   Monocytes Absolute 0.6 0.1 - 1.0 K/uL   Eosinophils Relative 4 %   Eosinophils Absolute 0.3 0.0 - 0.5 K/uL   Basophils Relative 1 %   Basophils Absolute 0.1 0.0 - 0.1 K/uL   Immature Granulocytes 0 %   Abs Immature Granulocytes 0.02 0.00 - 0.07 K/uL      EKG None  Radiology No results found.  Procedures Procedures    Medications Ordered in ED Medications  Tdap (BOOSTRIX) injection 0.5 mL (has no administration in time range)  amoxicillin-clavulanate (AUGMENTIN) 875-125 MG per tablet 1 tablet (has no administration in time range)    ED Course/ Medical Decision Making/ A&P                           Medical Decision Making Problems Addressed: Cellulitis of toe of right foot: acute illness or injury that poses a threat to life or bodily functions History of diabetes mellitus: chronic illness or injury that poses a threat to life or bodily functions Open wound of toe, initial encounter: acute illness or injury  Amount and/or Complexity of Data Reviewed External Data Reviewed: notes. Labs: ordered. Decision-making details documented in ED Course.  Risk Prescription drug management.   Labs ordered/sent.   Reviewed nursing notes and prior charts for additional history. External reports reviewed.    Labs reviewed/interpreted by me - wbc normal.   Wound cleaned, sterile dressing. Confirmed nkda.  Tetanus IM. Augmentin po.  Pt appears stable for d/c.  Return precautions provided.  Final Clinical Impression(s) / ED Diagnoses Final diagnoses:  None    Rx / DC Orders ED Discharge Orders     None         Lajean Saver, MD 02/09/22 1354

## 2022-02-09 NOTE — ED Triage Notes (Signed)
Patient was trimming his toe nails and trimmed too deep on his right middle toe. Causing pain to walk. He was worried about infection.

## 2022-02-09 NOTE — ED Provider Triage Note (Signed)
Emergency Medicine Provider Triage Evaluation Note  [name] , a 71 y.o. male  was evaluated in triage.  Pt complains of right middle toe pain onset the past couple of days. Pt notes that he was trimming his toe nails when he believes that he trimmed it too deep. Pt notes that he also has been ambulating while in [place] after cutting his toes. Has a history of DM and is compliant with his meds.   Review of Systems  Positive:  Negative:   Physical Exam  BP 104/65 (BP Location: Left Arm)   Pulse 92   Temp 98.6 F (37 C) (Oral)   Resp 15   SpO2 97%  Gen:   Awake, no distress   Resp:  Normal effort  MSK:   Moves extremities without difficulty  Other:  Erythema noted to right middle toe with open area noted to plantar aspect of toe.   Medical Decision Making  Medically screening exam initiated at 12:22 PM.  Appropriate orders placed.  [name] was informed that the remainder of the evaluation will be completed by another provider, this initial triage assessment does not replace that evaluation, and the importance of remaining in the ED until their evaluation is complete.  Work-up initiated.    Crystal Scarberry A, PA-C 02/09/22 1228

## 2022-02-09 NOTE — Discharge Instructions (Signed)
It was our pleasure to provide your ER care today - we hope that you feel better.  Keep area very clean - wash with soap and water 2x/day.  Take antibiotic (augmentin) as prescribed.   Drink plenty of fluids/stay well hydrated.   Follow up with primary care doctor in one week if symptoms fail to improve/resolve.  Return to ER if worse, new symptoms, fevers, spreading redness, severe pain, weak/faint, or other concern.

## 2022-02-27 ENCOUNTER — Encounter (HOSPITAL_BASED_OUTPATIENT_CLINIC_OR_DEPARTMENT_OTHER): Payer: Medicare Other | Attending: Internal Medicine | Admitting: Physician Assistant

## 2022-07-17 ENCOUNTER — Emergency Department (HOSPITAL_COMMUNITY)
Admission: EM | Admit: 2022-07-17 | Discharge: 2022-07-17 | Disposition: A | Discharge status: Home / Self Care | Payer: 59 | Attending: Emergency Medicine | Admitting: Emergency Medicine

## 2022-07-17 ENCOUNTER — Encounter (HOSPITAL_COMMUNITY): Payer: Self-pay

## 2022-07-17 ENCOUNTER — Emergency Department (HOSPITAL_COMMUNITY): Payer: 59

## 2022-07-17 ENCOUNTER — Other Ambulatory Visit: Payer: Self-pay

## 2022-07-17 DIAGNOSIS — Y92003 Bedroom of unspecified non-institutional (private) residence as the place of occurrence of the external cause: Secondary | ICD-10-CM | POA: Insufficient documentation

## 2022-07-17 DIAGNOSIS — E041 Nontoxic single thyroid nodule: Secondary | ICD-10-CM | POA: Insufficient documentation

## 2022-07-17 DIAGNOSIS — W010XXA Fall on same level from slipping, tripping and stumbling without subsequent striking against object, initial encounter: Secondary | ICD-10-CM | POA: Insufficient documentation

## 2022-07-17 DIAGNOSIS — R519 Headache, unspecified: Secondary | ICD-10-CM | POA: Diagnosis present

## 2022-07-17 DIAGNOSIS — W19XXXA Unspecified fall, initial encounter: Secondary | ICD-10-CM

## 2022-07-17 DIAGNOSIS — E119 Type 2 diabetes mellitus without complications: Secondary | ICD-10-CM | POA: Diagnosis not present

## 2022-07-17 DIAGNOSIS — T07XXXA Unspecified multiple injuries, initial encounter: Secondary | ICD-10-CM | POA: Diagnosis not present

## 2022-07-17 LAB — CBC WITH DIFFERENTIAL/PLATELET
Abs Immature Granulocytes: 0 10*3/uL (ref 0.00–0.07)
Basophils Absolute: 0.1 10*3/uL (ref 0.0–0.1)
Basophils Relative: 1 %
Eosinophils Absolute: 0.1 10*3/uL (ref 0.0–0.5)
Eosinophils Relative: 1 %
HCT: 41 % (ref 39.0–52.0)
Hemoglobin: 13.3 g/dL (ref 13.0–17.0)
Immature Granulocytes: 0 %
Lymphocytes Relative: 36 %
Lymphs Abs: 2.7 10*3/uL (ref 0.7–4.0)
MCH: 29 pg (ref 26.0–34.0)
MCHC: 32.4 g/dL (ref 30.0–36.0)
MCV: 89.5 fL (ref 80.0–100.0)
Monocytes Absolute: 0.4 10*3/uL (ref 0.1–1.0)
Monocytes Relative: 6 %
Neutro Abs: 4.2 10*3/uL (ref 1.7–7.7)
Neutrophils Relative %: 56 %
Platelets: 269 10*3/uL (ref 150–400)
RBC: 4.58 MIL/uL (ref 4.22–5.81)
RDW: 12.9 % (ref 11.5–15.5)
WBC: 7.5 10*3/uL (ref 4.0–10.5)
nRBC: 0 % (ref 0.0–0.2)

## 2022-07-17 LAB — COMPREHENSIVE METABOLIC PANEL
ALT: 22 U/L (ref 0–44)
AST: 17 U/L (ref 15–41)
Albumin: 3.9 g/dL (ref 3.5–5.0)
Alkaline Phosphatase: 80 U/L (ref 38–126)
Anion gap: 9 (ref 5–15)
BUN: 16 mg/dL (ref 8–23)
CO2: 24 mmol/L (ref 22–32)
Calcium: 8.9 mg/dL (ref 8.9–10.3)
Chloride: 104 mmol/L (ref 98–111)
Creatinine, Ser: 0.81 mg/dL (ref 0.61–1.24)
GFR, Estimated: >60 mL/min (ref >60)
Glucose, Bld: 240 mg/dL — ABNORMAL HIGH (ref 70–99)
Potassium: 3.8 mmol/L (ref 3.5–5.1)
Sodium: 137 mmol/L (ref 135–145)
Total Bilirubin: 0.9 mg/dL (ref 0.3–1.2)
Total Protein: 6.8 g/dL (ref 6.5–8.1)

## 2022-07-17 LAB — CBG MONITORING, ED: Glucose-Capillary: 251 mg/dL — ABNORMAL HIGH (ref 70–99)

## 2022-07-17 MED ORDER — AMOXICILLIN-POT CLAVULANATE 875-125 MG PO TABS: 1.0000 | ORAL_TABLET | Freq: Two times a day (BID) | ORAL | 0 refills | Status: DC

## 2022-07-17 NOTE — ED Triage Notes (Signed)
Pt BIB EMS for a fall. 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 Pt has some cut on the bottoms of his feet and c/o neck and back pain. Pt has c-collar in place from EMS.

## 2022-07-17 NOTE — ED Notes (Signed)
Discharge pending patient not being so drowsy.

## 2022-07-17 NOTE — Discharge Instructions (Signed)
Return for any problem.  ?

## 2022-07-17 NOTE — ED Notes (Signed)
[facility] contacted for scheduled pick up.

## 2022-07-17 NOTE — ED Provider Notes (Signed)
Patient seen after prior ED provider.  Patient is alert and ambulating without difficulty.  He desires discharge home.  Patient is advised that possible foreign body in feet cannot be fully excluded.  Patient is advised to closely follow-up with podiatry.  Patient is advised to complete course of antibiotics as prescribed.  Importance of close follow-up is stressed.  Strict return precautions given and understood.     Valarie Merino, MD 07/17/22 [phone]

## 2022-07-17 NOTE — ED Notes (Signed)
[facility] called to check on status, should be up next.

## 2022-07-17 NOTE — ED Provider Notes (Signed)
[facility] EMERGENCY DEPARTMENT AT [facility] Provider Note   CSN: [id number] Arrival date & time: 07/17/22  0222     History  Chief Complaint  Patient presents with   [name] is a 72 y.o. male.  The history is provided by the patient.  Fall  [name] is a 72 y.o. male who presents to the Emergency Department complaining of fall.  He presents to the emergency department for evaluation of injuries following a fall.  He states that he slipped on something wet in the bedroom and fell, striking a mirror and causing it to break.  He states that he struck his head and did walk on glass and there was significant blood on scene.  He has a mild headache, no additional symptoms.  He does have a history of diabetes.  He lives with his mother, who has dementia.      Home Medications Prior to Admission medications   Medication Sig Start Date End Date Taking? Authorizing Provider  amoxicillin-clavulanate (AUGMENTIN) 875-125 MG tablet Take 1 tablet by mouth every 12 (twelve) hours. 02/09/22   Lajean Saver, MD  atorvastatin (LIPITOR) 20 MG tablet Take 1 tablet (20 mg total) by mouth daily. Says has not been taking 10/16/21 11/15/21  Merrily Brittle, DO  Choline Fenofibrate (FENOFIBRIC ACID) 135 MG CPDR Take 135 mg by mouth daily. 05/17/21   [provider]  gabapentin (NEURONTIN) 300 MG capsule Take 600 mg by mouth at bedtime. 06/13/21   [provider]  NOVOLIN 70/30 (70-30) 100 UNIT/ML injection Inject 40 Units into the skin daily before breakfast. He says he takes 40 units daily (20 units per syringe at same time?) 05/18/21   [provider]  risperiDONE (RISPERDAL) 2 MG tablet Take 1 tablet (2 mg total) by mouth at bedtime. 10/16/21 11/15/21  Merrily Brittle, DO  TRULICITY 1.5 0000000 SOPN Inject 1.5 mg into the skin every 7 (seven) days. He says he has lost this in his house. 09/21/21   [provider]      Allergies    Patient has no known  allergies.    Review of Systems   Review of Systems  All other systems reviewed and are negative.   Physical Exam Updated Vital Signs BP 101/63   Pulse 87   Temp 98 F (36.7 C)   Resp 17   Wt 90.7 kg   SpO2 94%   BMI 28.70 kg/m  Physical Exam Vitals and nursing note reviewed.  Constitutional:      Appearance: He is well-developed.     Comments: Drowsy, awoken from sleep  HENT:     Head: Normocephalic and atraumatic.  Cardiovascular:     Rate and Rhythm: Normal rate and regular rhythm.  Pulmonary:     Effort: Pulmonary effort is normal. No respiratory distress.  Abdominal:     Palpations: Abdomen is soft.     Tenderness: There is no abdominal tenderness. There is no guarding or rebound.  Musculoskeletal:        General: No tenderness.     Comments: 2+ DP pulses bilaterally.  There are multiple punctate areas of the plantar surfaces of both feet with stigmata of recent bleeding.  Unable to palpate any retained glass.  There is an ulcer to the tip of the right third toe without significant erythema or edema.  Skin:    General: Skin is warm and dry.  Neurological:     Mental Status: He is  oriented to person, place, and time.     Comments: Dysarthric speech.5/5 strength in all four extremities  Psychiatric:     Comments: Unable to assess     ED Results / Procedures / Treatments   Labs (all labs ordered are listed, but only abnormal results are displayed) Labs Reviewed  COMPREHENSIVE METABOLIC PANEL - Abnormal; Notable for the following components:      Result Value   Glucose, Bld 240 (*)    All other components within normal limits  CBG MONITORING, ED - Abnormal; Notable for the following components:   Glucose-Capillary 251 (*)    All other components within normal limits  CBC WITH DIFFERENTIAL/PLATELET  URINALYSIS, ROUTINE W REFLEX MICROSCOPIC    EKG None  Radiology DG Foot 2 Views Right  Result Date: 07/17/2022 CLINICAL DATA:  72 year old male with  history of trauma to the bottom of the feet with multiple lacerations after stepping on glass. EXAM: RIGHT FOOT - 2 VIEW COMPARISON:  No priors. FINDINGS: There is no evidence of fracture or dislocation. There is no evidence of arthropathy or other focal bone abnormality. Tiny linear density in the soft tissues of the medial aspect of the third toe adjacent to the proximal phalanx. Soft tissues are otherwise unremarkable. IMPRESSION: 1. Small radiopaque foreign body, linear in appearance, associated with the soft tissues of the medial aspect of the third toe adjacent to the proximal phalanx. This is unlikely to represent a fragment of glass, but could represent a retained metallic density. Correlation with physical examination is recommended. Electronically Signed   By: Vinnie Langton M.D.   On: 07/17/2022 06:31   DG Foot 2 Views Left  Result Date: 07/17/2022 CLINICAL DATA:  72 year old male with history of laceration to the bottom of his feet after stepping on glass. EXAM: LEFT FOOT - 2 VIEW COMPARISON:  No priors. FINDINGS: There is no evidence of fracture or dislocation. There is no evidence of arthropathy or other focal bone abnormality. Soft tissues are unremarkable. Specifically, no definite radiopaque foreign bodies identified in the soft tissues. IMPRESSION: Negative. Electronically Signed   By: Vinnie Langton M.D.   On: 07/17/2022 06:29   CT Head Wo Contrast  Result Date: 07/17/2022 CLINICAL DATA:  Fall with head and neck injury. EXAM: CT HEAD WITHOUT CONTRAST CT CERVICAL SPINE WITHOUT CONTRAST TECHNIQUE: Multidetector CT imaging of the head and cervical spine was performed following the standard protocol without intravenous contrast. Multiplanar CT image reconstructions of the cervical spine were also generated. RADIATION DOSE REDUCTION: This exam was performed according to the departmental dose-optimization program which includes automated exposure control, adjustment of the mA and/or kV  according to patient size and/or use of iterative reconstruction technique. COMPARISON:  MR brain 12/01/2006. No prior cervical spine films or cross-sectional studies. FINDINGS: CT HEAD FINDINGS Brain: There is mild brachycephaly. Mild cerebral atrophy and small vessel disease and slight atrophic ventriculomegaly have developed since 2008. There is no midline shift. There is unilateral linear mineralization in the left basal ganglia likely senescent in etiology. A few benign dural calcifications in the frontal falx. Cerebellum and brainstem are unremarkable. No asymmetry is seen worrisome for acute infarct, hemorrhage or mass. Basal cisterns are clear. Vascular: No hyperdense vessel or unexpected calcification. Skull: Negative for fractures or focal lesions. There is no visible scalp hematoma. Sinuses/Orbits: No acute finding. Other: None. CT CERVICAL SPINE FINDINGS Alignment: There is a 2-3 mm grade 1 degenerative anterolisthesis at C3-4, with moderate facet hypertrophy at this level. The alignment  is otherwise normal. Narrowing and spurring of the anterior atlantodental joint is incidentally seen. Skull base and vertebrae: No acute fracture is evident. No primary bone lesion or focal pathologic process. Soft tissues and spinal canal: No prevertebral fluid or swelling. No visible canal hematoma. There is a 1.2 cm hypodense nodule in the superior pole of the left lobe of the thyroid. There is no laryngeal mass. Disc levels: There is preservation of the normal cervical disc heights. There are small bidirectional endplate spurs from 075-GRM through C6-7 but the resulting posterior disc osteophyte complexes do not significantly encroach on the thecal sac and cord nor is there evidence of herniated discs or cord compromise. There are multilevel facet joint and uncinate spurs. Degenerative foraminal stenosis is mild on the right at C3-4. The other foramina are clear. Upper chest: There is a 4 mm subsolid nodule anteriorly  in the left upper lobe apex on 4:91. Otherwise negative. Other: None. IMPRESSION: 1. No acute intracranial CT findings or depressed skull fractures. 2. Atrophy and small-vessel disease. Mild brachycephalic appearance. 3. Degenerative changes of the cervical spine without evidence of fractures. 4. 4 mm subsolid nodule in the left upper lobe apex. No follow-up recommended. This recommendation follows the consensus statement: Guidelines for Management of Incidental Pulmonary Nodules Detected on CT Images: From the Fleischner Society 2017; Radiology 2017; 284:228-243. 5. 1.2 cm left thyroid nodule. Nonemergent thyroid ultrasound recommended. Electronically Signed   By: Telford Nab M.D.   On: 07/17/2022 06:27   CT Cervical Spine Wo Contrast  Result Date: 07/17/2022 CLINICAL DATA:  Fall with head and neck injury. EXAM: CT HEAD WITHOUT CONTRAST CT CERVICAL SPINE WITHOUT CONTRAST TECHNIQUE: Multidetector CT imaging of the head and cervical spine was performed following the standard protocol without intravenous contrast. Multiplanar CT image reconstructions of the cervical spine were also generated. RADIATION DOSE REDUCTION: This exam was performed according to the departmental dose-optimization program which includes automated exposure control, adjustment of the mA and/or kV according to patient size and/or use of iterative reconstruction technique. COMPARISON:  MR brain 12/01/2006. No prior cervical spine films or cross-sectional studies. FINDINGS: CT HEAD FINDINGS Brain: There is mild brachycephaly. Mild cerebral atrophy and small vessel disease and slight atrophic ventriculomegaly have developed since 2008. There is no midline shift. There is unilateral linear mineralization in the left basal ganglia likely senescent in etiology. A few benign dural calcifications in the frontal falx. Cerebellum and brainstem are unremarkable. No asymmetry is seen worrisome for acute infarct, hemorrhage or mass. Basal cisterns are  clear. Vascular: No hyperdense vessel or unexpected calcification. Skull: Negative for fractures or focal lesions. There is no visible scalp hematoma. Sinuses/Orbits: No acute finding. Other: None. CT CERVICAL SPINE FINDINGS Alignment: There is a 2-3 mm grade 1 degenerative anterolisthesis at C3-4, with moderate facet hypertrophy at this level. The alignment is otherwise normal. Narrowing and spurring of the anterior atlantodental joint is incidentally seen. Skull base and vertebrae: No acute fracture is evident. No primary bone lesion or focal pathologic process. Soft tissues and spinal canal: No prevertebral fluid or swelling. No visible canal hematoma. There is a 1.2 cm hypodense nodule in the superior pole of the left lobe of the thyroid. There is no laryngeal mass. Disc levels: There is preservation of the normal cervical disc heights. There are small bidirectional endplate spurs from 075-GRM through C6-7 but the resulting posterior disc osteophyte complexes do not significantly encroach on the thecal sac and cord nor is there evidence of herniated discs or  cord compromise. There are multilevel facet joint and uncinate spurs. Degenerative foraminal stenosis is mild on the right at C3-4. The other foramina are clear. Upper chest: There is a 4 mm subsolid nodule anteriorly in the left upper lobe apex on 4:91. Otherwise negative. Other: None. IMPRESSION: 1. No acute intracranial CT findings or depressed skull fractures. 2. Atrophy and small-vessel disease. Mild brachycephalic appearance. 3. Degenerative changes of the cervical spine without evidence of fractures. 4. 4 mm subsolid nodule in the left upper lobe apex. No follow-up recommended. This recommendation follows the consensus statement: Guidelines for Management of Incidental Pulmonary Nodules Detected on CT Images: From the Fleischner Society 2017; Radiology 2017; 284:228-243. 5. 1.2 cm left thyroid nodule. Nonemergent thyroid ultrasound recommended.  Electronically Signed   By: Telford Nab M.D.   On: 07/17/2022 06:27    Procedures Procedures    Medications Ordered in ED Medications - No data to display  ED Course/ Medical Decision Making/ A&P                             Medical Decision Making Amount and/or Complexity of Data Reviewed Labs: ordered. Radiology: ordered.   Patient with history of diabetes, schizophrenia here for evaluation following a fall and walking on glass.  He does have some superficial abrasions to the foot that do not need repair.  There is no definite foreign body on physical examination.  Plain films are concerning for foreign body on the medial aspect of the third toe.  There is no wound in this region.  There is a ulcer to the distal aspect of the toe.  This ulcer appears chronic.  His tetanus was updated in 2023.  CT head and C-spine are negative for acute traumatic injury.  Discussed finding of thyroid nodule that we will need outpatient follow-up.  Patient does continue to be drowsy in the emergency department.  He states that he took a sleep aid, but he is not sure what it was.  Plan to continue to monitor for metabolization.  Patient care transferred pending metabolization and reevaluation.        Final Clinical Impression(s) / ED Diagnoses Final diagnoses:  Fall, initial encounter  Multiple abrasions  Thyroid nodule    Rx / DC Orders ED Discharge Orders     None         Quintella Reichert, MD 07/17/22 [phone]

## 2023-01-25 ENCOUNTER — Encounter (HOSPITAL_COMMUNITY): Payer: Self-pay | Admitting: Internal Medicine

## 2023-01-25 ENCOUNTER — Inpatient Hospital Stay (HOSPITAL_COMMUNITY)
Admission: EM | Admit: 2023-01-25 | Discharge: 2023-01-29 | DRG: 682 | Disposition: A | Discharge status: Home Health | Payer: 59 | Attending: Family Medicine | Admitting: Family Medicine

## 2023-01-25 ENCOUNTER — Emergency Department (HOSPITAL_COMMUNITY): Payer: 59

## 2023-01-25 ENCOUNTER — Other Ambulatory Visit: Payer: Self-pay

## 2023-01-25 DIAGNOSIS — I959 Hypotension, unspecified: Secondary | ICD-10-CM | POA: Diagnosis present

## 2023-01-25 DIAGNOSIS — Z1152 Encounter for screening for COVID-19: Secondary | ICD-10-CM

## 2023-01-25 DIAGNOSIS — Z79899 Other long term (current) drug therapy: Secondary | ICD-10-CM

## 2023-01-25 DIAGNOSIS — F209 Schizophrenia, unspecified: Secondary | ICD-10-CM | POA: Diagnosis not present

## 2023-01-25 DIAGNOSIS — Z634 Disappearance and death of family member: Secondary | ICD-10-CM

## 2023-01-25 DIAGNOSIS — Z7985 Long-term (current) use of injectable non-insulin antidiabetic drugs: Secondary | ICD-10-CM

## 2023-01-25 DIAGNOSIS — F0390 Unspecified dementia without behavioral disturbance: Secondary | ICD-10-CM | POA: Diagnosis present

## 2023-01-25 DIAGNOSIS — E876 Hypokalemia: Secondary | ICD-10-CM | POA: Diagnosis present

## 2023-01-25 DIAGNOSIS — E872 Acidosis, unspecified: Secondary | ICD-10-CM | POA: Diagnosis present

## 2023-01-25 DIAGNOSIS — R197 Diarrhea, unspecified: Secondary | ICD-10-CM | POA: Diagnosis present

## 2023-01-25 DIAGNOSIS — N179 Acute kidney failure, unspecified: Secondary | ICD-10-CM | POA: Diagnosis not present

## 2023-01-25 DIAGNOSIS — Z7984 Long term (current) use of oral hypoglycemic drugs: Secondary | ICD-10-CM

## 2023-01-25 DIAGNOSIS — Z794 Long term (current) use of insulin: Secondary | ICD-10-CM

## 2023-01-25 DIAGNOSIS — E119 Type 2 diabetes mellitus without complications: Secondary | ICD-10-CM

## 2023-01-25 DIAGNOSIS — Z823 Family history of stroke: Secondary | ICD-10-CM

## 2023-01-25 DIAGNOSIS — G9341 Metabolic encephalopathy: Secondary | ICD-10-CM | POA: Diagnosis present

## 2023-01-25 DIAGNOSIS — E1165 Type 2 diabetes mellitus with hyperglycemia: Secondary | ICD-10-CM | POA: Diagnosis present

## 2023-01-25 LAB — COMPREHENSIVE METABOLIC PANEL
ALT: 12 U/L (ref 0–44)
AST: 12 U/L — ABNORMAL LOW (ref 15–41)
Albumin: 2.5 g/dL — ABNORMAL LOW (ref 3.5–5.0)
Alkaline Phosphatase: 50 U/L (ref 38–126)
Anion gap: 7 (ref 5–15)
BUN: 29 mg/dL — ABNORMAL HIGH (ref 8–23)
CO2: 15 mmol/L — ABNORMAL LOW (ref 22–32)
Calcium: 6.2 mg/dL — CL (ref 8.9–10.3)
Chloride: 118 mmol/L — ABNORMAL HIGH (ref 98–111)
Creatinine, Ser: 2.22 mg/dL — ABNORMAL HIGH (ref 0.61–1.24)
GFR, Estimated: 31 mL/min — ABNORMAL LOW (ref >60)
Glucose, Bld: 141 mg/dL — ABNORMAL HIGH (ref 70–99)
Potassium: 2.8 mmol/L — ABNORMAL LOW (ref 3.5–5.1)
Sodium: 140 mmol/L (ref 135–145)
Total Bilirubin: 0.5 mg/dL (ref 0.3–1.2)
Total Protein: 4.4 g/dL — ABNORMAL LOW (ref 6.5–8.1)

## 2023-01-25 LAB — CBC WITH DIFFERENTIAL/PLATELET
Abs Immature Granulocytes: 0.03 10*3/uL (ref 0.00–0.07)
Basophils Absolute: 0 10*3/uL (ref 0.0–0.1)
Basophils Relative: 0 %
Eosinophils Absolute: 0 10*3/uL (ref 0.0–0.5)
Eosinophils Relative: 0 %
HCT: 34.7 % — ABNORMAL LOW (ref 39.0–52.0)
Hemoglobin: 11.1 g/dL — ABNORMAL LOW (ref 13.0–17.0)
Immature Granulocytes: 0 %
Lymphocytes Relative: 16 %
Lymphs Abs: 1.3 10*3/uL (ref 0.7–4.0)
MCH: 29.1 pg (ref 26.0–34.0)
MCHC: 32 g/dL (ref 30.0–36.0)
MCV: 90.8 fL (ref 80.0–100.0)
Monocytes Absolute: 0.8 10*3/uL (ref 0.1–1.0)
Monocytes Relative: 10 %
Neutro Abs: 5.7 10*3/uL (ref 1.7–7.7)
Neutrophils Relative %: 74 %
Platelets: 312 10*3/uL (ref 150–400)
RBC: 3.82 MIL/uL — ABNORMAL LOW (ref 4.22–5.81)
RDW: 13.7 % (ref 11.5–15.5)
WBC: 7.9 10*3/uL (ref 4.0–10.5)
nRBC: 0 % (ref 0.0–0.2)

## 2023-01-25 LAB — BASIC METABOLIC PANEL
Anion gap: 9 (ref 5–15)
BUN: 31 mg/dL — ABNORMAL HIGH (ref 8–23)
CO2: 17 mmol/L — ABNORMAL LOW (ref 22–32)
Calcium: 6.7 mg/dL — ABNORMAL LOW (ref 8.9–10.3)
Chloride: 117 mmol/L — ABNORMAL HIGH (ref 98–111)
Creatinine, Ser: 2.55 mg/dL — ABNORMAL HIGH (ref 0.61–1.24)
GFR, Estimated: 26 mL/min — ABNORMAL LOW (ref >60)
Glucose, Bld: 129 mg/dL — ABNORMAL HIGH (ref 70–99)
Potassium: 3.2 mmol/L — ABNORMAL LOW (ref 3.5–5.1)
Sodium: 143 mmol/L (ref 135–145)

## 2023-01-25 LAB — URINALYSIS, ROUTINE W REFLEX MICROSCOPIC
Bilirubin Urine: NEGATIVE
Glucose, UA: NEGATIVE mg/dL
Hgb urine dipstick: NEGATIVE
Ketones, ur: NEGATIVE mg/dL
Leukocytes,Ua: NEGATIVE
Nitrite: NEGATIVE
Protein, ur: NEGATIVE mg/dL
Specific Gravity, Urine: 1.008 (ref 1.005–1.030)
pH: 5 (ref 5.0–8.0)

## 2023-01-25 LAB — RESP PANEL BY RT-PCR (RSV, FLU A&B, COVID)  RVPGX2
Influenza A by PCR: NEGATIVE
Influenza B by PCR: NEGATIVE
Resp Syncytial Virus by PCR: NEGATIVE
SARS Coronavirus 2 by RT PCR: NEGATIVE

## 2023-01-25 LAB — CBG MONITORING, ED
Glucose-Capillary: 225 mg/dL — ABNORMAL HIGH (ref 70–99)
Glucose-Capillary: 181 mg/dL — ABNORMAL HIGH (ref 70–99)

## 2023-01-25 LAB — LIPASE, BLOOD: Lipase: 26 U/L (ref 11–51)

## 2023-01-25 LAB — HEMOGLOBIN A1C
Hgb A1c MFr Bld: 7.9 % — ABNORMAL HIGH (ref 4.8–5.6)
Mean Plasma Glucose: 180.03 mg/dL

## 2023-01-25 LAB — MAGNESIUM: Magnesium: 1.2 mg/dL — ABNORMAL LOW (ref 1.7–2.4)

## 2023-01-25 LAB — I-STAT CG4 LACTIC ACID, ED
Lactic Acid, Venous: 2.2 mmol/L (ref 0.5–1.9)
Lactic Acid, Venous: 1.4 mmol/L (ref 0.5–1.9)

## 2023-01-25 LAB — GLUCOSE, CAPILLARY: Glucose-Capillary: 145 mg/dL — ABNORMAL HIGH (ref 70–99)

## 2023-01-25 MED ORDER — ACETAMINOPHEN 325 MG PO TABS: 650.0000 mg | ORAL_TABLET | Freq: Four times a day (QID) | ORAL | Status: DC | PRN

## 2023-01-25 MED ORDER — HEPARIN SODIUM (PORCINE) 5000 UNIT/ML IJ SOLN
5000.0000 [IU] | Freq: Three times a day (TID) | INTRAMUSCULAR | Status: DC
  Administered 2023-01-25 – 2023-01-29 (×11): 5000 [IU] via SUBCUTANEOUS
  Filled 2023-01-25 (×11): qty 1

## 2023-01-25 MED ORDER — ACETAMINOPHEN 650 MG RE SUPP: 650.0000 mg | Freq: Four times a day (QID) | RECTAL | Status: DC | PRN

## 2023-01-25 MED ORDER — SODIUM CHLORIDE 0.9 % IV SOLN
1.0000 g | Freq: Once | INTRAVENOUS | Status: AC
  Administered 2023-01-25: 1 g via INTRAVENOUS
  Filled 2023-01-25: qty 10

## 2023-01-25 MED ORDER — ONDANSETRON HCL 4 MG PO TABS: 4.0000 mg | ORAL_TABLET | Freq: Four times a day (QID) | ORAL | Status: DC | PRN

## 2023-01-25 MED ORDER — ONDANSETRON HCL 4 MG/2ML IJ SOLN: 4.0000 mg | Freq: Four times a day (QID) | INTRAMUSCULAR | Status: DC | PRN

## 2023-01-25 MED ORDER — POTASSIUM CHLORIDE 10 MEQ/100ML IV SOLN
10.0000 meq | Freq: Once | INTRAVENOUS | Status: AC
  Administered 2023-01-25: 10 meq via INTRAVENOUS
  Filled 2023-01-25: qty 100

## 2023-01-25 MED ORDER — POTASSIUM CHLORIDE 10 MEQ/100ML IV SOLN
10.0000 meq | INTRAVENOUS | Status: AC
  Administered 2023-01-25 (×3): 10 meq via INTRAVENOUS
  Filled 2023-01-25 (×3): qty 100

## 2023-01-25 MED ORDER — LACTATED RINGERS IV SOLN: INTRAVENOUS | Status: AC

## 2023-01-25 MED ORDER — SODIUM CHLORIDE 0.9 % IV BOLUS
1000.0000 mL | Freq: Once | INTRAVENOUS | Status: AC
  Administered 2023-01-25: 1000 mL via INTRAVENOUS

## 2023-01-25 MED ORDER — MAGNESIUM SULFATE 2 GM/50ML IV SOLN
2.0000 g | Freq: Once | INTRAVENOUS | Status: AC
  Administered 2023-01-25: 2 g via INTRAVENOUS
  Filled 2023-01-25: qty 50

## 2023-01-25 MED ORDER — INSULIN ASPART 100 UNIT/ML IJ SOLN
0.0000 [IU] | INTRAMUSCULAR | Status: DC
  Administered 2023-01-25: 2 [IU] via SUBCUTANEOUS
  Administered 2023-01-26: 1 [IU] via SUBCUTANEOUS
  Administered 2023-01-26: 5 [IU] via SUBCUTANEOUS
  Administered 2023-01-26 (×2): 2 [IU] via SUBCUTANEOUS
  Filled 2023-01-25: qty 0.09

## 2023-01-25 MED ORDER — POTASSIUM CHLORIDE CRYS ER 20 MEQ PO TBCR
40.0000 meq | EXTENDED_RELEASE_TABLET | Freq: Once | ORAL | Status: AC
  Administered 2023-01-25: 40 meq via ORAL
  Filled 2023-01-25: qty 2

## 2023-01-25 MED ORDER — METRONIDAZOLE 500 MG/100ML IV SOLN
500.0000 mg | Freq: Two times a day (BID) | INTRAVENOUS | Status: DC
  Administered 2023-01-25 – 2023-01-26 (×2): 500 mg via INTRAVENOUS
  Filled 2023-01-25 (×2): qty 100

## 2023-01-25 MED ORDER — CALCIUM GLUCONATE-NACL 1-0.675 GM/50ML-% IV SOLN
1.0000 g | Freq: Once | INTRAVENOUS | Status: AC
  Administered 2023-01-25: 1000 mg via INTRAVENOUS
  Filled 2023-01-25: qty 50

## 2023-01-25 NOTE — Assessment & Plan Note (Addendum)
Likely pre-renal due to diarrhea + hypotension (hypotension either due to volume depletion and / or build up of risperidone causing side effects). IVF Strict intake and output Repeat BMP in AM No findings on CT AP to suggest obstruction playing a role. Replacing Ca, Mg, K

## 2023-01-25 NOTE — ED Notes (Signed)
ED TO INPATIENT HANDOFF REPORT  ED Nurse Name and Phone #: Rebecca Eaton RN [phone]  S Name/Age/Gender [name] 72 y.o. male Room/Bed: WA21/WA21  Code Status   Code Status: Full Code  Home/SNF/Other Home Patient oriented to: self, place, time, and situation Is this baseline? Yes   Triage Complete: Triage complete  Chief Complaint AKI (acute kidney injury) (HCC) [N17.9]  Triage Note Pt BIBA from home. C/o dizziness, diarrhea fpr 3x days. Pt hypotensive at 71/45 on EMS arrival. [facility] RN mentioned pt has been taking 2 tablets of Risperdal every day.  Aox4  Given 450 NS   Allergies No Known Allergies  Level of Care/Admitting Diagnosis ED Disposition     ED Disposition  Admit   Condition  --   Comment  Hospital Area: [facility] [[id number]]  Level of Care: Med-Surg [16]  May place patient in observation at [facility] or [facility] if equivalent level of care is available:: No  Covid Evaluation: Asymptomatic - no recent exposure (last 10 days) testing not required  Diagnosis: AKI (acute kidney injury) [facility]) [[id number]]  Admitting Physician: Hillary Bow [phone]  Attending Physician: Hillary Bow [id number]          B Medical/Surgery History Past Medical History:  Diagnosis Date   Diabetes mellitus without complication (HCC)    No past surgical history on file.   A IV Location/Drains/Wounds Patient Lines/Drains/Airways Status     Active Line/Drains/Airways     Name Placement date Placement time Site Days   Peripheral IV 01/25/23 18 G Left Antecubital 01/25/23  1522  Antecubital  less than 1   Peripheral IV 01/25/23 20 G 1" Right Antecubital 01/25/23  1831  Antecubital  less than 1            Intake/Output Last 24 hours No intake or output data in the 24 hours ending 01/25/23 2126  Labs/Imaging Results for orders placed or performed during the hospital encounter of 01/25/23 (from the past 48 hour(s))  CBG monitoring, ED      Status: Abnormal   Collection Time: 01/25/23  3:29 PM  Result Value Ref Range   Glucose-Capillary 225 (H) 70 - 99 mg/dL    Comment: Glucose reference range applies only to samples taken after fasting for at least 8 hours.  I-Stat CG4 Lactic Acid     Status: Abnormal   Collection Time: 01/25/23  4:14 PM  Result Value Ref Range   Lactic Acid, Venous 2.2 (HH) 0.5 - 1.9 mmol/L   Comment NOTIFIED PHYSICIAN   CBC with Differential     Status: Abnormal   Collection Time: 01/25/23  4:55 PM  Result Value Ref Range   WBC 7.9 4.0 - 10.5 K/uL   RBC 3.82 (L) 4.22 - 5.81 MIL/uL   Hemoglobin 11.1 (L) 13.0 - 17.0 g/dL   HCT 13.0 (L) 86.5 - 78.4 %   MCV 90.8 80.0 - 100.0 fL   MCH 29.1 26.0 - 34.0 pg   MCHC 32.0 30.0 - 36.0 g/dL   RDW 69.6 29.5 - 28.4 %   Platelets 312 150 - 400 K/uL   nRBC 0.0 0.0 - 0.2 %   Neutrophils Relative % 74 %   Neutro Abs 5.7 1.7 - 7.7 K/uL   Lymphocytes Relative 16 %   Lymphs Abs 1.3 0.7 - 4.0 K/uL   Monocytes Relative 10 %   Monocytes Absolute 0.8 0.1 - 1.0 K/uL   Eosinophils Relative 0 %   Eosinophils Absolute  0.0 0.0 - 0.5 K/uL   Basophils Relative 0 %   Basophils Absolute 0.0 0.0 - 0.1 K/uL   Immature Granulocytes 0 %   Abs Immature Granulocytes 0.03 0.00 - 0.07 K/uL    Comment: Performed at [facility], [address] [place], [place] [address]  Comprehensive metabolic panel     Status: Abnormal   Collection Time: 01/25/23  4:55 PM  Result Value Ref Range   Sodium 140 135 - 145 mmol/L   Potassium 2.8 (L) 3.5 - 5.1 mmol/L   Chloride 118 (H) 98 - 111 mmol/L   CO2 15 (L) 22 - 32 mmol/L   Glucose, Bld 141 (H) 70 - 99 mg/dL    Comment: Glucose reference range applies only to samples taken after fasting for at least 8 hours.   BUN 29 (H) 8 - 23 mg/dL   Creatinine, Ser 7.82 (H) 0.61 - 1.24 mg/dL   Calcium 6.2 (LL) 8.9 - 10.3 mg/dL    Comment: CRITICAL RESULT CALLED TO, READ BACK BY AND VERIFIED WITH  SHAW, S RN AT 1733 ON 01/25/2023 BY  Deedra Ehrich, K    Total Protein 4.4 (L) 6.5 - 8.1 g/dL   Albumin 2.5 (L) 3.5 - 5.0 g/dL   AST 12 (L) 15 - 41 U/L   ALT 12 0 - 44 U/L   Alkaline Phosphatase 50 38 - 126 U/L   Total Bilirubin 0.5 0.3 - 1.2 mg/dL   GFR, Estimated 31 (L) >60 mL/min    Comment: (NOTE) Calculated using the CKD-EPI Creatinine Equation (2021)    Anion gap 7 5 - 15    Comment: Performed at [facility], [address] [place], [place] [address]  Lipase, blood     Status: None   Collection Time: 01/25/23  4:55 PM  Result Value Ref Range   Lipase 26 11 - 51 U/L    Comment: Performed at [facility], [address] [place], [place] [address]  Basic metabolic panel     Status: Abnormal   Collection Time: 01/25/23  6:00 PM  Result Value Ref Range   Sodium 143 135 - 145 mmol/L   Potassium 3.2 (L) 3.5 - 5.1 mmol/L   Chloride 117 (H) 98 - 111 mmol/L   CO2 17 (L) 22 - 32 mmol/L   Glucose, Bld 129 (H) 70 - 99 mg/dL    Comment: Glucose reference range applies only to samples taken after fasting for at least 8 hours.   BUN 31 (H) 8 - 23 mg/dL   Creatinine, Ser 7.84 (H) 0.61 - 1.24 mg/dL   Calcium 6.7 (L) 8.9 - 10.3 mg/dL   GFR, Estimated 26 (L) >60 mL/min    Comment: (NOTE) Calculated using the CKD-EPI Creatinine Equation (2021)    Anion gap 9 5 - 15    Comment: Performed at [facility], [address], [place], [place] [address]  Magnesium     Status: Abnormal   Collection Time: 01/25/23  6:00 PM  Result Value Ref Range   Magnesium 1.2 (L) 1.7 - 2.4 mg/dL    Comment: Performed at [facility], [address], [place], [place] [address]  Urinalysis, Routine w reflex microscopic -Urine, Clean Catch     Status: None   Collection Time: 01/25/23  6:12 PM  Result Value Ref Range   Color, Urine YELLOW YELLOW   APPearance CLEAR CLEAR   Specific Gravity, Urine 1.008 1.005 - 1.030  pH 5.0 5.0 - 8.0   Glucose, UA NEGATIVE NEGATIVE mg/dL    Hgb urine dipstick NEGATIVE NEGATIVE   Bilirubin Urine NEGATIVE NEGATIVE   Ketones, ur NEGATIVE NEGATIVE mg/dL   Protein, ur NEGATIVE NEGATIVE mg/dL   Nitrite NEGATIVE NEGATIVE   Leukocytes,Ua NEGATIVE NEGATIVE    Comment: Performed at [facility], [address] [place], [place] [address]  I-Stat CG4 Lactic Acid     Status: None   Collection Time: 01/25/23  6:20 PM  Result Value Ref Range   Lactic Acid, Venous 1.4 0.5 - 1.9 mmol/L  Resp panel by RT-PCR (RSV, Flu A&B, Covid) Anterior Nasal Swab     Status: None   Collection Time: 01/25/23  6:41 PM   Specimen: Anterior Nasal Swab  Result Value Ref Range   SARS Coronavirus 2 by RT PCR NEGATIVE NEGATIVE    Comment: (NOTE) SARS-CoV-2 target nucleic acids are NOT DETECTED.  The SARS-CoV-2 RNA is generally detectable in upper respiratory specimens during the acute phase of infection. The lowest concentration of SARS-CoV-2 viral copies this assay can detect is 138 copies/mL. A negative result does not preclude SARS-Cov-2 infection and should not be used as the sole basis for treatment or other patient management decisions. A negative result may occur with  improper specimen collection/handling, submission of specimen other than nasopharyngeal swab, presence of viral mutation(s) within the areas targeted by this assay, and inadequate number of viral copies(<138 copies/mL). A negative result must be combined with clinical observations, patient history, and epidemiological information. The expected result is Negative.  Fact Sheet for Patients:  BloggerCourse.com  Fact Sheet for Healthcare Providers:  SeriousBroker.it  This test is no t yet approved or cleared by the [place] FDA and  has been authorized for detection and/or diagnosis of SARS-CoV-2 by FDA under an Emergency Use Authorization (EUA). This EUA will remain  in effect (meaning this test can be used)  for the duration of the COVID-19 declaration under Section 564(b)(1) of the Act, 21 U.S.C.section 360bbb-3(b)(1), unless the authorization is terminated  or revoked sooner.       Influenza A by PCR NEGATIVE NEGATIVE   Influenza B by PCR NEGATIVE NEGATIVE    Comment: (NOTE) The Xpert Xpress SARS-CoV-2/FLU/RSV plus assay is intended as an aid in the diagnosis of influenza from Nasopharyngeal swab specimens and should not be used as a sole basis for treatment. Nasal washings and aspirates are unacceptable for Xpert Xpress SARS-CoV-2/FLU/RSV testing.  Fact Sheet for Patients: BloggerCourse.com  Fact Sheet for Healthcare Providers: SeriousBroker.it  This test is not yet approved or cleared by the [place] FDA and has been authorized for detection and/or diagnosis of SARS-CoV-2 by FDA under an Emergency Use Authorization (EUA). This EUA will remain in effect (meaning this test can be used) for the duration of the COVID-19 declaration under Section 564(b)(1) of the Act, 21 U.S.C. section 360bbb-3(b)(1), unless the authorization is terminated or revoked.     Resp Syncytial Virus by PCR NEGATIVE NEGATIVE    Comment: (NOTE) Fact Sheet for Patients: BloggerCourse.com  Fact Sheet for Healthcare Providers: SeriousBroker.it  This test is not yet approved or cleared by the [place] FDA and has been authorized for detection and/or diagnosis of SARS-CoV-2 by FDA under an Emergency Use Authorization (EUA). This EUA will remain in effect (meaning this test can be used) for the duration of the COVID-19 declaration under Section 564(b)(1) of the Act, 21 U.S.C. section 360bbb-3(b)(1), unless the authorization is terminated or  revoked.  Performed at [facility], [address] [place], [place] [address]   CBG monitoring, ED     Status: Abnormal   Collection  Time: 01/25/23  8:57 PM  Result Value Ref Range   Glucose-Capillary 181 (H) 70 - 99 mg/dL    Comment: Glucose reference range applies only to samples taken after fasting for at least 8 hours.   CT ABDOMEN PELVIS WO CONTRAST  Result Date: 01/25/2023 CLINICAL DATA:  Abdominal pain EXAM: CT ABDOMEN AND PELVIS WITHOUT CONTRAST TECHNIQUE: Multidetector CT imaging of the abdomen and pelvis was performed following the standard protocol without IV contrast. RADIATION DOSE REDUCTION: This exam was performed according to the departmental dose-optimization program which includes automated exposure control, adjustment of the mA and/or kV according to patient size and/or use of iterative reconstruction technique. COMPARISON:  None Available. FINDINGS: Lower chest: Atelectasis or scarring in the lung bases. No effusions. Hepatobiliary: Small layering gallstones within the gallbladder. No focal hepatic abnormality or biliary ductal dilatation. Pancreas: No focal abnormality or ductal dilatation. Spleen: No focal abnormality.  Normal size. Adrenals/Urinary Tract: Punctate 1 mm nonobstructing stone in the midpole of the left kidney. No renal or adrenal mass. No ureteral stones or hydronephrosis. Urinary bladder unremarkable. Stomach/Bowel: Sigmoid diverticulosis. No active diverticulitis. Stomach and small bowel decompressed, unremarkable. Normal appendix. Vascular/Lymphatic: No evidence of aneurysm or adenopathy. Aortic atherosclerosis. Reproductive: Prostate enlargement Other: No free fluid or free air. Musculoskeletal: No acute bony abnormality. IMPRESSION: No acute findings. Cholelithiasis.  No CT evidence of acute cholecystitis. Punctate left nephrolithiasis.  No hydronephrosis. Aortic atherosclerosis. Sigmoid diverticulosis. Electronically Signed   By: Charlett Nose M.D.   On: 01/25/2023 19:18   CT HEAD WO CONTRAST ( )  Result Date: 01/25/2023 CLINICAL DATA:  Mental status change, unknown cause.  Dizziness. EXAM:  CT HEAD WITHOUT CONTRAST TECHNIQUE: Contiguous axial images were obtained from the base of the skull through the vertex without intravenous contrast. RADIATION DOSE REDUCTION: This exam was performed according to the departmental dose-optimization program which includes automated exposure control, adjustment of the mA and/or kV according to patient size and/or use of iterative reconstruction technique. COMPARISON:  07/17/2022 FINDINGS: Brain: Age related volume loss. No acute intracranial abnormality. Specifically, no hemorrhage, hydrocephalus, mass lesion, acute infarction, or significant intracranial injury. Vascular: No hyperdense vessel or unexpected calcification. Skull: No acute calvarial abnormality. Sinuses/Orbits: No acute findings Other: None IMPRESSION: No acute intracranial abnormality. Electronically Signed   By: Charlett Nose M.D.   On: 01/25/2023 19:16   DG Chest Portable 1 View  Result Date: 01/25/2023 CLINICAL DATA:  Dizziness and hypotension EXAM: PORTABLE CHEST 1 VIEW COMPARISON:  Radiograph 06/02/2017 FINDINGS: Stable cardiomediastinal silhouette. Aortic atherosclerotic calcification. No focal consolidation, pleural effusion, or pneumothorax. No displaced rib fractures. IMPRESSION: No acute cardiopulmonary disease. Electronically Signed   By: Minerva Fester M.D.   On: 01/25/2023 17:21    Pending Labs Unresulted Labs (From admission, onward)     Start     Ordered   01/26/23 0500  Comprehensive metabolic panel  Tomorrow morning,   R        01/25/23 2010   01/26/23 0500  Magnesium  Tomorrow morning,   R        01/25/23 2010   01/25/23 2013  Hemoglobin A1c  Once,   URGENT       Comments: To assess prior glycemic control    01/25/23 2012   01/25/23 2011  Gastrointestinal Panel by PCR , Stool  (Gastrointestinal Panel  by PCR, Stool                                                                                                                                                     **Does  Not include CLOSTRIDIUM DIFFICILE testing. **If CDIFF testing is needed, place order from the "C Difficile Testing" order set.**)  Once,   URGENT        01/25/23 2010   01/25/23 1957  C Difficile Quick Screen w PCR reflex  (C Difficile quick screen w PCR reflex panel )  Once, for 24 hours,   URGENT       References:    CDiff Information Tool   01/25/23 1956   01/25/23 1538  Culture, blood (single)  Once,   STAT        01/25/23 1538            Vitals/Pain Today's Vitals   01/25/23 1600 01/25/23 1700 01/25/23 1753 01/25/23 1915  BP: (!) 93/57 (!) 112/57    Pulse: 91 81    Resp: (!) 26 (!) 21    Temp:   98.9 F (37.2 C)   TempSrc:   Oral   SpO2: 95% 98%    Weight:      Height:      PainSc:    0-No pain    Isolation Precautions Enteric precautions (UV disinfection)  Medications Medications  metroNIDAZOLE (FLAGYL) IVPB 500 mg (0 mg Intravenous Stopped 01/25/23 1957)  potassium chloride 10 mEq in 100 mL IVPB (10 mEq Intravenous New Bag/Given 01/25/23 2053)  magnesium sulfate IVPB 2 g 50 mL (2 g Intravenous New Bag/Given 01/25/23 2047)  calcium gluconate 1 g/ 50 mL sodium chloride IVPB (has no administration in time range)  insulin aspart (novoLOG) injection 0-9 Units (2 Units Subcutaneous Given 01/25/23 2103)  lactated ringers infusion (has no administration in time range)  heparin injection 5,000 Units (has no administration in time range)  acetaminophen (TYLENOL) tablet 650 mg (has no administration in time range)    Or  acetaminophen (TYLENOL) suppository 650 mg (has no administration in time range)  ondansetron (ZOFRAN) tablet 4 mg (has no administration in time range)    Or  ondansetron (ZOFRAN) injection 4 mg (has no administration in time range)  sodium chloride 0.9 % bolus 1,000 mL (0 mLs Intravenous Stopped 01/25/23 1905)  cefTRIAXone (ROCEPHIN) 1 g in sodium chloride 0.9 % 100 mL IVPB (0 g Intravenous Stopped 01/25/23 1749)  calcium gluconate 1 g/ 50 mL sodium  chloride IVPB (0 mg Intravenous Stopped 01/25/23 1957)  potassium chloride SA (KLOR-CON M) CR tablet 40 mEq (40 mEq Oral Given 01/25/23 1859)    Mobility walks     Focused Assessments     R Recommendations: See Admitting Provider Note  Report given to:   Additional Notes:

## 2023-01-25 NOTE — ED Notes (Signed)
Pt states that he is unable to give [place] a urine sample. Tried numerous of time and nothing came out.

## 2023-01-25 NOTE — H&P (Signed)
History and Physical    Patient: [name] AVW:[id number] DOB: 1951/02/05 DOA: 01/25/2023 DOS: the patient was seen and examined on 01/25/2023 PCP: Patient, No Pcp Per  Patient coming from: Home  Chief Complaint:  Chief Complaint  Patient presents with   Dizziness   Hypotension   Diarrhea   HPI: [name] is a 72 y.o. male with medical history significant of DM, schizophrenia.  Pt recently hospitalized in [place] x2 weeks ago for 6 days due to hyperglycemia from DM.  Pt with weakness, diarrhea for past 5 days.  Persistent, now dizziness and lightheadedness / near syncope / orthostatic.  No fever, no URI symptoms, no N/V.  Patient lives at home with his caregiver who felt that his symptoms related to this new medication. He is unable to tell me which new medications.  Triage note states "[facility] RN mentioned pt has been taking 2 tablets of Risperdal every day."  Pt with BP 71/45 with EMS.  Has AKI on ED work up.  Review of Systems: As mentioned in the history of present illness. All other systems reviewed and are negative. Past Medical History:  Diagnosis Date   Diabetes mellitus without complication (HCC)    No past surgical history on file. Social History:  reports that he has never smoked. He has never used smokeless tobacco. He reports current alcohol use. He reports that he does not use drugs.  No Known Allergies  Family History  Problem Relation Age of Onset   Stroke Mother    Cancer Father     Prior to Admission medications   Medication Sig Start Date End Date Taking? Authorizing Provider  atorvastatin (LIPITOR) 20 MG tablet Take 1 tablet (20 mg total) by mouth daily. Says has not been taking 10/16/21 01/25/23 Yes Princess Bruins, DO  Choline Fenofibrate (FENOFIBRIC ACID) 135 MG CPDR Take 135 mg by mouth daily. 05/17/21  Yes [provider]  gabapentin (NEURONTIN) 300 MG capsule Take 600 mg by mouth at bedtime. 06/13/21  Yes [provider]   linagliptin (TRADJENTA) 5 MG TABS tablet Take 5 mg by mouth daily.   Yes [provider]  METFORMIN HCL PO Take 1 tablet by mouth in the morning and at bedtime.   Yes [provider]  risperiDONE (RISPERDAL) 2 MG tablet Take 1 tablet (2 mg total) by mouth at bedtime. 10/16/21 01/25/23 Yes Princess Bruins, DO  NOVOLIN 70/30 (70-30) 100 UNIT/ML injection Inject 40 Units into the skin daily before breakfast. He says he takes 40 units daily (20 units per syringe at same time?) Patient not taking: Reported on 01/25/2023 05/18/21   [provider]  TRULICITY 1.5 MG/0.5ML SOPN Inject 1.5 mg into the skin every 7 (seven) days. He says he has lost this in his house. Patient not taking: Reported on 01/25/2023 09/21/21   [provider]    Physical Exam: Vitals:   01/25/23 1526 01/25/23 1600 01/25/23 1700 01/25/23 1753  BP:  (!) 93/57 (!) 112/57   Pulse:  91 81   Resp:  (!) 26 (!) 21   Temp:    98.9 F (37.2 C)  TempSrc:    Oral  SpO2:  95% 98%   Weight: 90.5 kg     Height: 5\' 10"  (1.778 m)      Constitutional: NAD, calm, comfortable ENMT: Mucous membranes are dry. Posterior pharynx clear of any exudate or lesions.Normal dentition.  Respiratory: clear to auscultation bilaterally, no wheezing, no crackles. Normal respiratory effort. No accessory muscle use.  Cardiovascular: Regular rate and rhythm, no murmurs / rubs / gallops. No extremity edema. 2+ pedal pulses. No carotid bruits.  Abdomen: no tenderness, no masses palpated. No hepatosplenomegaly. Bowel sounds positive.  Neurologic: CN 2-12 grossly intact. Sensation intact, DTR normal. Strength 5/5 in all 4.  Psychiatric: Normal judgment and insight. Alert and oriented x 3. Normal mood.   Data Reviewed:    Labs on Admission: I have personally reviewed following labs and imaging studies  CBC: Recent Labs  Lab 01/25/23 1655  WBC 7.9  NEUTROABS 5.7  HGB 11.1*  HCT 34.7*  MCV 90.8  PLT 312   Basic  Metabolic Panel: Recent Labs  Lab 01/25/23 1655 01/25/23 1800  NA 140 143  K 2.8* 3.2*  CL 118* 117*  CO2 15* 17*  GLUCOSE 141* 129*  BUN 29* 31*  CREATININE 2.22* 2.55*  CALCIUM 6.2* 6.7*  MG  --  1.2*   GFR: Estimated Creatinine Clearance: 29.6 mL/min (A) (by C-G formula based on SCr of 2.55 mg/dL (H)). Liver Function Tests: Recent Labs  Lab 01/25/23 1655  AST 12*  ALT 12  ALKPHOS 50  BILITOT 0.5  PROT 4.4*  ALBUMIN 2.5*   Recent Labs  Lab 01/25/23 1655  LIPASE 26   No results for input(s): "AMMONIA" in the last 168 hours. Coagulation Profile: No results for input(s): "INR", "PROTIME" in the last 168 hours. Cardiac Enzymes: No results for input(s): "CKTOTAL", "CKMB", "CKMBINDEX", "TROPONINI" in the last 168 hours. BNP (last 3 results) No results for input(s): "PROBNP" in the last 8760 hours. HbA1C: No results for input(s): "HGBA1C" in the last 72 hours. CBG: Recent Labs  Lab 01/25/23 1529  GLUCAP 225*   Lipid Profile: No results for input(s): "CHOL", "HDL", "LDLCALC", "TRIG", "CHOLHDL", "LDLDIRECT" in the last 72 hours. Thyroid Function Tests: No results for input(s): "TSH", "T4TOTAL", "FREET4", "T3FREE", "THYROIDAB" in the last 72 hours. Anemia Panel: No results for input(s): "VITAMINB12", "FOLATE", "FERRITIN", "TIBC", "IRON", "RETICCTPCT" in the last 72 hours. Urine analysis:    Component Value Date/Time   COLORURINE YELLOW 01/25/2023 1812   APPEARANCEUR CLEAR 01/25/2023 1812   LABSPEC 1.008 01/25/2023 1812   PHURINE 5.0 01/25/2023 1812   GLUCOSEU NEGATIVE 01/25/2023 1812   HGBUR NEGATIVE 01/25/2023 1812   BILIRUBINUR NEGATIVE 01/25/2023 1812   KETONESUR NEGATIVE 01/25/2023 1812   PROTEINUR NEGATIVE 01/25/2023 1812   UROBILINOGEN 0.2 11/25/2006 2134   NITRITE NEGATIVE 01/25/2023 1812   LEUKOCYTESUR NEGATIVE 01/25/2023 1812    Radiological Exams on Admission: CT ABDOMEN PELVIS WO CONTRAST  Result Date: 01/25/2023 CLINICAL DATA:  Abdominal  pain EXAM: CT ABDOMEN AND PELVIS WITHOUT CONTRAST TECHNIQUE: Multidetector CT imaging of the abdomen and pelvis was performed following the standard protocol without IV contrast. RADIATION DOSE REDUCTION: This exam was performed according to the departmental dose-optimization program which includes automated exposure control, adjustment of the mA and/or kV according to patient size and/or use of iterative reconstruction technique. COMPARISON:  None Available. FINDINGS: Lower chest: Atelectasis or scarring in the lung bases. No effusions. Hepatobiliary: Small layering gallstones within the gallbladder. No focal hepatic abnormality or biliary ductal dilatation. Pancreas: No focal abnormality or ductal dilatation. Spleen: No focal abnormality.  Normal size. Adrenals/Urinary Tract: Punctate 1 mm nonobstructing stone in the midpole of the left kidney. No renal or adrenal mass. No ureteral stones or hydronephrosis. Urinary bladder unremarkable. Stomach/Bowel: Sigmoid diverticulosis. No active diverticulitis. Stomach and small bowel decompressed, unremarkable. Normal appendix. Vascular/Lymphatic: No evidence of aneurysm or adenopathy. Aortic atherosclerosis. Reproductive: Prostate  enlargement Other: No free fluid or free air. Musculoskeletal: No acute bony abnormality. IMPRESSION: No acute findings. Cholelithiasis.  No CT evidence of acute cholecystitis. Punctate left nephrolithiasis.  No hydronephrosis. Aortic atherosclerosis. Sigmoid diverticulosis. Electronically Signed   By: Charlett Nose M.D.   On: 01/25/2023 19:18   CT HEAD WO CONTRAST ( )  Result Date: 01/25/2023 CLINICAL DATA:  Mental status change, unknown cause.  Dizziness. EXAM: CT HEAD WITHOUT CONTRAST TECHNIQUE: Contiguous axial images were obtained from the base of the skull through the vertex without intravenous contrast. RADIATION DOSE REDUCTION: This exam was performed according to the departmental dose-optimization program which includes automated  exposure control, adjustment of the mA and/or kV according to patient size and/or use of iterative reconstruction technique. COMPARISON:  07/17/2022 FINDINGS: Brain: Age related volume loss. No acute intracranial abnormality. Specifically, no hemorrhage, hydrocephalus, mass lesion, acute infarction, or significant intracranial injury. Vascular: No hyperdense vessel or unexpected calcification. Skull: No acute calvarial abnormality. Sinuses/Orbits: No acute findings Other: None IMPRESSION: No acute intracranial abnormality. Electronically Signed   By: Charlett Nose M.D.   On: 01/25/2023 19:16   DG Chest Portable 1 View  Result Date: 01/25/2023 CLINICAL DATA:  Dizziness and hypotension EXAM: PORTABLE CHEST 1 VIEW COMPARISON:  Radiograph 06/02/2017 FINDINGS: Stable cardiomediastinal silhouette. Aortic atherosclerotic calcification. No focal consolidation, pleural effusion, or pneumothorax. No displaced rib fractures. IMPRESSION: No acute cardiopulmonary disease. Electronically Signed   By: Minerva Fester M.D.   On: 01/25/2023 17:21    EKG: Independently reviewed.   Assessment and Plan: * AKI (acute kidney injury) (HCC) Likely pre-renal due to diarrhea + hypotension (hypotension either due to volume depletion and / or build up of risperidone causing side effects). IVF Strict intake and output Repeat BMP in AM No findings on CT AP to suggest obstruction playing a role. Replacing Ca, Mg, K  Diarrhea Presumed infectious No CT AP findings No WBC, afebrile in ED C.diff pending COVID, FLU, RSV neg GI pathogen pnl pending EDP gave single doses of rocephin + flagyl IVF  Schizophrenia (HCC) Med rec pending, sounds like pt on 2 tabs of Risperidone a day now per triage note? Regardless, with AKI + hypotension, hold for tonight: "Ri???rid??? is extensively hepatically metabolized to 9-hydroxyrisperidone (9-OH-RIS [paliperidone]) (active metabolite) that is mainly excreted by the kidney (Ref).  Ri???ri?on? and 9-hydroxyrisperidone clearance are decreased by ~60% in patients with CrCl <60 mL/minute/1.73 m2, which may increase risk of adverse effects (eg, orthostatic hypotension, QT prolongation)".  QT nl on EKG, but pt definitely hypotensive with SBPs in the 70s on presentation. Med rec pending Hold risperidone tonight.  DM (diabetes mellitus) (HCC) No anion gap on todays BMP.  BGL 129 Med rec pending Sensitive SSI Q4H for the moment. Hold metformin      Advance Care Planning:   Code Status: Full Code  Consults: None  Family Communication: No family in room  Severity of Illness: The appropriate patient status for this patient is OBSERVATION. Observation status is judged to be reasonable and necessary in order to provide the required intensity of service to ensure the patient's safety. The patient's presenting symptoms, physical exam findings, and initial radiographic and laboratory data in the context of their medical condition is felt to place them at decreased risk for further clinical deterioration. Furthermore, it is anticipated that the patient will be medically stable for discharge from the hospital within 2 midnights of admission.   Author: Hillary Bow., DO 01/25/2023 8:27 PM  For on call review [email or web address].

## 2023-01-25 NOTE — Assessment & Plan Note (Signed)
No anion gap on todays BMP.  BGL 129 Med rec pending Sensitive SSI Q4H for the moment. Hold metformin

## 2023-01-25 NOTE — ED Notes (Signed)
Lactic istat resulted at 2.21 Dara paramedic and Dr. Silverio Lay aware

## 2023-01-25 NOTE — Assessment & Plan Note (Signed)
Presumed infectious No CT AP findings No WBC, afebrile in ED C.diff pending COVID, FLU, RSV neg GI pathogen pnl pending EDP gave single doses of rocephin + flagyl IVF

## 2023-01-25 NOTE — Assessment & Plan Note (Signed)
Med rec pending, sounds like pt on 2 tabs of Risperidone a day now per triage note? Regardless, with AKI + hypotension, hold for tonight: "Ri???rid??? is extensively hepatically metabolized to 9-hydroxyrisperidone (9-OH-RIS [paliperidone]) (active metabolite) that is mainly excreted by the kidney (Ref). Ri???ri?on? and 9-hydroxyrisperidone clearance are decreased by ~60% in patients with CrCl <60 mL/minute/1.73 m2, which may increase risk of adverse effects (eg, orthostatic hypotension, QT prolongation)".  QT nl on EKG, but pt definitely hypotensive with SBPs in the 70s on presentation. Med rec pending Hold risperidone tonight.

## 2023-01-25 NOTE — ED Provider Notes (Signed)
[place] EMERGENCY DEPARTMENT AT [facility] Provider Note   CSN: [id number] Arrival date & time: 01/25/23  1510     History  No chief complaint on file.   [name] is a 72 y.o. male.  The history is provided by the patient and medical records. No language interpreter was used.     72 year old male with a history of diabetes, schizophrenia, presenting complaining of lightheadedness.  Patient reports for the past 5 days he endorsed having generalized fatigue, having persistent diarrhea and now having bouts of dizziness and lightheadedness.  Dizziness described more as near syncope worse with positional change.  He endorsed mild chills.  Does not endorse any fever, runny nose sneezing coughing chest pain shortness of breath or dysuria.  No significant nausea or vomiting.  No recent antibiotic use.  He did mention he was hospitalized at a hospital in [place] 2 weeks ago and was there for 6 days due to hyperglycemia from his diabetes.  Patient lives at home with his caregiver who felt that his symptoms related to this new medication.  He is unable to tell me which new medications.  Home Medications Prior to Admission medications   Medication Sig Start Date End Date Taking? Authorizing Provider  amoxicillin-clavulanate (AUGMENTIN) 875-125 MG tablet Take 1 tablet by mouth every 12 (twelve) hours. 02/09/22   Cathren Laine, MD  amoxicillin-clavulanate (AUGMENTIN) 875-125 MG tablet Take 1 tablet by mouth every 12 (twelve) hours. 07/17/22   Wynetta Fines, MD  atorvastatin (LIPITOR) 20 MG tablet Take 1 tablet (20 mg total) by mouth daily. Says has not been taking 10/16/21 11/15/21  Princess Bruins, DO  Choline Fenofibrate (FENOFIBRIC ACID) 135 MG CPDR Take 135 mg by mouth daily. 05/17/21   [provider]  gabapentin (NEURONTIN) 300 MG capsule Take 600 mg by mouth at bedtime. 06/13/21   [provider]  NOVOLIN 70/30 (70-30) 100 UNIT/ML injection Inject 40 Units into  the skin daily before breakfast. He says he takes 40 units daily (20 units per syringe at same time?) 05/18/21   [provider]  risperiDONE (RISPERDAL) 2 MG tablet Take 1 tablet (2 mg total) by mouth at bedtime. 10/16/21 11/15/21  Princess Bruins, DO  TRULICITY 1.5 MG/0.5ML SOPN Inject 1.5 mg into the skin every 7 (seven) days. He says he has lost this in his house. 09/21/21   [provider]      Allergies    Patient has no known allergies.    Review of Systems   Review of Systems  All other systems reviewed and are negative.   Physical Exam Updated Vital Signs BP (!) 112/57   Pulse 81   Temp 98.9 F (37.2 C) (Oral)   Resp (!) 21   Ht 5\' 10"  (1.778 m)   Wt 90.5 kg   SpO2 98%   BMI 28.63 kg/m  Physical Exam Vitals and nursing note reviewed.  Constitutional:      General: He is not in acute distress.    Appearance: He is well-developed.  HENT:     Head: Atraumatic.     Mouth/Throat:     Mouth: Mucous membranes are dry.  Eyes:     Conjunctiva/sclera: Conjunctivae normal.  Cardiovascular:     Rate and Rhythm: Normal rate and regular rhythm.     Pulses: Normal pulses.     Heart sounds: Normal heart sounds.  Pulmonary:     Effort: Pulmonary effort is normal.     Breath sounds:  Normal breath sounds.  Abdominal:     Palpations: Abdomen is soft.     Tenderness: There is no abdominal tenderness.  Musculoskeletal:        General: Normal range of motion.     Cervical back: Neck supple.     Comments: Equal strength throughout all 4 extremities with poor effort.  Skin:    Findings: No rash.  Neurological:     Mental Status: He is alert and oriented to person, place, and time.     ED Results / Procedures / Treatments   Labs (all labs ordered are listed, but only abnormal results are displayed) Labs Reviewed  CBC WITH DIFFERENTIAL/PLATELET - Abnormal; Notable for the following components:      Result Value   RBC 3.82 (*)    Hemoglobin 11.1 (*)    HCT 34.7  (*)    All other components within normal limits  COMPREHENSIVE METABOLIC PANEL - Abnormal; Notable for the following components:   Potassium 2.8 (*)    Chloride 118 (*)    CO2 15 (*)    Glucose, Bld 141 (*)    BUN 29 (*)    Creatinine, Ser 2.22 (*)    Calcium 6.2 (*)    Total Protein 4.4 (*)    Albumin 2.5 (*)    AST 12 (*)    GFR, Estimated 31 (*)    All other components within normal limits  BASIC METABOLIC PANEL - Abnormal; Notable for the following components:   Potassium 3.2 (*)    Chloride 117 (*)    CO2 17 (*)    Glucose, Bld 129 (*)    BUN 31 (*)    Creatinine, Ser 2.55 (*)    Calcium 6.7 (*)    GFR, Estimated 26 (*)    All other components within normal limits  MAGNESIUM - Abnormal; Notable for the following components:   Magnesium 1.2 (*)    All other components within normal limits  CBG MONITORING, ED - Abnormal; Notable for the following components:   Glucose-Capillary 225 (*)    All other components within normal limits  I-STAT CG4 LACTIC ACID, ED - Abnormal; Notable for the following components:   Lactic Acid, Venous 2.2 (*)    All other components within normal limits  RESP PANEL BY RT-PCR (RSV, FLU A&B, COVID)  RVPGX2  CULTURE, BLOOD (SINGLE)  C DIFFICILE QUICK SCREEN W PCR REFLEX    GASTROINTESTINAL PANEL BY PCR, STOOL (REPLACES STOOL CULTURE)  LIPASE, BLOOD  URINALYSIS, ROUTINE W REFLEX MICROSCOPIC  COMPREHENSIVE METABOLIC PANEL  MAGNESIUM  HEMOGLOBIN A1C  I-STAT CG4 LACTIC ACID, ED    EKG EKG Interpretation Date/Time:  Saturday January 25 2023 15:37:17 EDT Ventricular Rate:  92 PR Interval:  139 QRS Duration:  101 QT Interval:  353 QTC Calculation: 437 R Axis:   -61  Text Interpretation: Sinus rhythm Left anterior fascicular block Abnormal R-wave progression, late transition ST elevation, consider inferior injury Since last tracing rate faster Confirmed by Richardean Canal [phone]) on 01/25/2023 3:58:28 PM  Radiology CT ABDOMEN PELVIS WO  CONTRAST  Result Date: 01/25/2023 CLINICAL DATA:  Abdominal pain EXAM: CT ABDOMEN AND PELVIS WITHOUT CONTRAST TECHNIQUE: Multidetector CT imaging of the abdomen and pelvis was performed following the standard protocol without IV contrast. RADIATION DOSE REDUCTION: This exam was performed according to the departmental dose-optimization program which includes automated exposure control, adjustment of the mA and/or kV according to patient size and/or use of iterative reconstruction technique. COMPARISON:  None Available. FINDINGS: Lower chest: Atelectasis or scarring in the lung bases. No effusions. Hepatobiliary: Small layering gallstones within the gallbladder. No focal hepatic abnormality or biliary ductal dilatation. Pancreas: No focal abnormality or ductal dilatation. Spleen: No focal abnormality.  Normal size. Adrenals/Urinary Tract: Punctate 1 mm nonobstructing stone in the midpole of the left kidney. No renal or adrenal mass. No ureteral stones or hydronephrosis. Urinary bladder unremarkable. Stomach/Bowel: Sigmoid diverticulosis. No active diverticulitis. Stomach and small bowel decompressed, unremarkable. Normal appendix. Vascular/Lymphatic: No evidence of aneurysm or adenopathy. Aortic atherosclerosis. Reproductive: Prostate enlargement Other: No free fluid or free air. Musculoskeletal: No acute bony abnormality. IMPRESSION: No acute findings. Cholelithiasis.  No CT evidence of acute cholecystitis. Punctate left nephrolithiasis.  No hydronephrosis. Aortic atherosclerosis. Sigmoid diverticulosis. Electronically Signed   By: Charlett Nose M.D.   On: 01/25/2023 19:18   CT HEAD WO CONTRAST ( )  Result Date: 01/25/2023 CLINICAL DATA:  Mental status change, unknown cause.  Dizziness. EXAM: CT HEAD WITHOUT CONTRAST TECHNIQUE: Contiguous axial images were obtained from the base of the skull through the vertex without intravenous contrast. RADIATION DOSE REDUCTION: This exam was performed according to the  departmental dose-optimization program which includes automated exposure control, adjustment of the mA and/or kV according to patient size and/or use of iterative reconstruction technique. COMPARISON:  07/17/2022 FINDINGS: Brain: Age related volume loss. No acute intracranial abnormality. Specifically, no hemorrhage, hydrocephalus, mass lesion, acute infarction, or significant intracranial injury. Vascular: No hyperdense vessel or unexpected calcification. Skull: No acute calvarial abnormality. Sinuses/Orbits: No acute findings Other: None IMPRESSION: No acute intracranial abnormality. Electronically Signed   By: Charlett Nose M.D.   On: 01/25/2023 19:16   DG Chest Portable 1 View  Result Date: 01/25/2023 CLINICAL DATA:  Dizziness and hypotension EXAM: PORTABLE CHEST 1 VIEW COMPARISON:  Radiograph 06/02/2017 FINDINGS: Stable cardiomediastinal silhouette. Aortic atherosclerotic calcification. No focal consolidation, pleural effusion, or pneumothorax. No displaced rib fractures. IMPRESSION: No acute cardiopulmonary disease. Electronically Signed   By: Minerva Fester M.D.   On: 01/25/2023 17:21    Procedures .Critical Care  Performed by: Fayrene Helper, PA-C Authorized by: Fayrene Helper, PA-C   Critical care provider statement:    Critical care time (minutes):  50   Critical care was time spent personally by me on the following activities:  Development of treatment plan with patient or surrogate, discussions with consultants, evaluation of patient's response to treatment, examination of patient, ordering and review of laboratory studies, ordering and review of radiographic studies, ordering and performing treatments and interventions, pulse oximetry, re-evaluation of patient's condition and review of old charts     Medications Ordered in ED Medications  metroNIDAZOLE (FLAGYL) IVPB 500 mg (0 mg Intravenous Stopped 01/25/23 1957)  potassium chloride 10 mEq in 100 mL IVPB (10 mEq Intravenous New Bag/Given  01/25/23 1957)  magnesium sulfate IVPB 2 g 50 mL (has no administration in time range)  calcium gluconate 1 g/ 50 mL sodium chloride IVPB (has no administration in time range)  insulin aspart (novoLOG) injection 0-9 Units (has no administration in time range)  lactated ringers infusion (has no administration in time range)  heparin injection 5,000 Units (has no administration in time range)  acetaminophen (TYLENOL) tablet 650 mg (has no administration in time range)    Or  acetaminophen (TYLENOL) suppository 650 mg (has no administration in time range)  ondansetron (ZOFRAN) tablet 4 mg (has no administration in time range)    Or  ondansetron (ZOFRAN) injection 4 mg (has no administration in  time range)  sodium chloride 0.9 % bolus 1,000 mL (0 mLs Intravenous Stopped 01/25/23 1905)  cefTRIAXone (ROCEPHIN) 1 g in sodium chloride 0.9 % 100 mL IVPB (0 g Intravenous Stopped 01/25/23 1749)  calcium gluconate 1 g/ 50 mL sodium chloride IVPB (0 mg Intravenous Stopped 01/25/23 1957)  potassium chloride SA (KLOR-CON M) CR tablet 40 mEq (40 mEq Oral Given 01/25/23 1859)    ED Course/ Medical Decision Making/ A&P                                 Medical Decision Making Amount and/or Complexity of Data Reviewed Labs: ordered. Radiology: ordered. ECG/medicine tests: ordered.  Risk Prescription drug management. Decision regarding hospitalization.   BP (!) 112/57   Pulse 81   Temp 98.9 F (37.2 C) (Oral)   Resp (!) 21   Ht 5\' 10"  (1.778 m)   Wt 90.5 kg   SpO2 98%   BMI 28.63 kg/m   19:59 PM  72 year old male with a history of diabetes, schizophrenia, presenting complaining of lightheadedness.  Patient reports for the past 5 days he endorsed having generalized fatigue, having persistent diarrhea and now having bouts of dizziness and lightheadedness.  Dizziness described more as near syncope worse with positional change.  He endorsed mild chills.  Does not endorse any fever, runny nose  sneezing coughing chest pain shortness of breath or dysuria.  No significant nausea or vomiting.  No recent antibiotic use.  He did mention he was hospitalized at a hospital in [place] 2 weeks ago and was there for 6 days due to hyperglycemia from his diabetes.  Patient lives at home with his caregiver who felt that his symptoms related to this new medication.  He is unable to tell me which new medications.  On exam, patient is laying in bed, appears fatigued but nontoxic in appearance.  Mouth is dry, heart with mild tachycardia, lungs are clear to auscultation bilaterally abdomen is soft nontender with bowel sounds present.  Initial blood pressure was in the 70s systolic.  Patient did receive fluid and 50 cc of IV fluid via EMS and blood pressure did improve to 90 systolic.  Mildly tachycardic.  He does appear dehydrated.  Will give both oral and IV hydration.  Workup initiated.  Low suspicion for infectious cause at this moment however will check lactic acid, white count, and will obtain blood culture.  -Labs ordered, independently viewed and interpreted by me.  Labs remarkable for an oral creatinine of 2.22 likely secondary to dehydration.  Patient given IV fluid.  Potassium is 2.8, supplementation given.  EKG obtained.  Calcium is 6.2, repeat labs shows a calcium of 6.7.  Calcium gluconate given.  Magnesium is low at 1.2, supplementation given as well.  Urine without signs of urine tract infection, chest x-ray unremarkable, no obvious source of infection noted.  I suspect patient's elevated lactic acid is likely secondary to dehydration and less likely to be infectious cause however out of abundance of precaution patient was given antibiotic including Rocephin and metronidazole.  An abdominal pelvis CT scan without concerning finding.  Will consult for admission.  Blood pressure did improve with IV fluid and p.o. fluid. -The patient was maintained on a cardiac monitor.  I personally viewed and  interpreted the cardiac monitored which showed an underlying rhythm of: NSR -Imaging independently viewed and interpreted by me and I agree with radiologist's interpretation.  Abd/pelvis CT  showing no acute finding, head CT unremarkable, CXR reassuring -This patient presents to the ED for concern of diarrhea, this involves an extensive number of treatment options, and is a complaint that carries with it a high risk of complications and morbidity.  The differential diagnosis includes medication side effect, infectious diarrhea, dehydration, colitis, diverticular disease, pna, UTI -Co morbidities that complicate the patient evaluation includes DM, schizophrenia -Treatment includes potassium, magnesium, rocephine, flagyl, IVF, calcium gluconate -Reevaluation of the patient after these medicines showed that the patient improved -PCP office notes or outside notes reviewed -Discussion with attending DR. Silverio Lay. Hospitalist Dr. Julian Reil who will admit pt -Escalation to admission/observation considered: patient is agreeable with admission         Final Clinical Impression(s) / ED Diagnoses Final diagnoses:  AKI (acute kidney injury) (HCC)  Hypokalemia  Hypomagnesemia  Hypocalcemia    Rx / DC Orders ED Discharge Orders     None         Fayrene Helper, PA-C 01/25/23 2033    Charlynne Pander, MD 01/25/23 2208

## 2023-01-25 NOTE — ED Triage Notes (Signed)
Pt BIBA from home. C/o dizziness, diarrhea fpr 3x days. Pt hypotensive at 71/45 on EMS arrival. [facility] RN mentioned pt has been taking 2 tablets of Risperdal every day.  Aox4  Given 450 NS

## 2023-01-26 DIAGNOSIS — Z79899 Other long term (current) drug therapy: Secondary | ICD-10-CM | POA: Diagnosis not present

## 2023-01-26 DIAGNOSIS — Z7984 Long term (current) use of oral hypoglycemic drugs: Secondary | ICD-10-CM | POA: Diagnosis not present

## 2023-01-26 DIAGNOSIS — G9341 Metabolic encephalopathy: Secondary | ICD-10-CM | POA: Diagnosis present

## 2023-01-26 DIAGNOSIS — Z823 Family history of stroke: Secondary | ICD-10-CM | POA: Diagnosis not present

## 2023-01-26 DIAGNOSIS — Z1152 Encounter for screening for COVID-19: Secondary | ICD-10-CM | POA: Diagnosis not present

## 2023-01-26 DIAGNOSIS — Z794 Long term (current) use of insulin: Secondary | ICD-10-CM | POA: Diagnosis not present

## 2023-01-26 DIAGNOSIS — I959 Hypotension, unspecified: Secondary | ICD-10-CM | POA: Diagnosis present

## 2023-01-26 DIAGNOSIS — N179 Acute kidney failure, unspecified: Secondary | ICD-10-CM | POA: Diagnosis present

## 2023-01-26 DIAGNOSIS — E1165 Type 2 diabetes mellitus with hyperglycemia: Secondary | ICD-10-CM | POA: Diagnosis present

## 2023-01-26 DIAGNOSIS — R197 Diarrhea, unspecified: Secondary | ICD-10-CM | POA: Diagnosis present

## 2023-01-26 DIAGNOSIS — F209 Schizophrenia, unspecified: Secondary | ICD-10-CM | POA: Diagnosis present

## 2023-01-26 DIAGNOSIS — Z634 Disappearance and death of family member: Secondary | ICD-10-CM | POA: Diagnosis not present

## 2023-01-26 DIAGNOSIS — E872 Acidosis, unspecified: Secondary | ICD-10-CM | POA: Diagnosis present

## 2023-01-26 DIAGNOSIS — F0390 Unspecified dementia without behavioral disturbance: Secondary | ICD-10-CM | POA: Diagnosis present

## 2023-01-26 DIAGNOSIS — E876 Hypokalemia: Secondary | ICD-10-CM | POA: Diagnosis present

## 2023-01-26 DIAGNOSIS — Z7985 Long-term (current) use of injectable non-insulin antidiabetic drugs: Secondary | ICD-10-CM | POA: Diagnosis not present

## 2023-01-26 LAB — URINALYSIS, COMPLETE (UACMP) WITH MICROSCOPIC
Bilirubin Urine: NEGATIVE
Glucose, UA: NEGATIVE mg/dL
Hgb urine dipstick: NEGATIVE
Ketones, ur: NEGATIVE mg/dL
Nitrite: NEGATIVE
Protein, ur: NEGATIVE mg/dL
Specific Gravity, Urine: 1.011 (ref 1.005–1.030)
pH: 5 (ref 5.0–8.0)

## 2023-01-26 LAB — COMPREHENSIVE METABOLIC PANEL
ALT: 15 U/L (ref 0–44)
AST: 11 U/L — ABNORMAL LOW (ref 15–41)
Albumin: 3.4 g/dL — ABNORMAL LOW (ref 3.5–5.0)
Alkaline Phosphatase: 68 U/L (ref 38–126)
Anion gap: 8 (ref 5–15)
BUN: 36 mg/dL — ABNORMAL HIGH (ref 8–23)
CO2: 20 mmol/L — ABNORMAL LOW (ref 22–32)
Calcium: 8.6 mg/dL — ABNORMAL LOW (ref 8.9–10.3)
Chloride: 112 mmol/L — ABNORMAL HIGH (ref 98–111)
Creatinine, Ser: 2.97 mg/dL — ABNORMAL HIGH (ref 0.61–1.24)
GFR, Estimated: 22 mL/min — ABNORMAL LOW (ref >60)
Glucose, Bld: 147 mg/dL — ABNORMAL HIGH (ref 70–99)
Potassium: 4.5 mmol/L (ref 3.5–5.1)
Sodium: 140 mmol/L (ref 135–145)
Total Bilirubin: 0.6 mg/dL (ref 0.3–1.2)
Total Protein: 5.7 g/dL — ABNORMAL LOW (ref 6.5–8.1)

## 2023-01-26 LAB — MAGNESIUM: Magnesium: 1.6 mg/dL — ABNORMAL LOW (ref 1.7–2.4)

## 2023-01-26 LAB — GLUCOSE, CAPILLARY
Glucose-Capillary: 135 mg/dL — ABNORMAL HIGH (ref 70–99)
Glucose-Capillary: 155 mg/dL — ABNORMAL HIGH (ref 70–99)
Glucose-Capillary: 151 mg/dL — ABNORMAL HIGH (ref 70–99)
Glucose-Capillary: 269 mg/dL — ABNORMAL HIGH (ref 70–99)
Glucose-Capillary: 193 mg/dL — ABNORMAL HIGH (ref 70–99)
Glucose-Capillary: 157 mg/dL — ABNORMAL HIGH (ref 70–99)

## 2023-01-26 MED ORDER — MAGNESIUM SULFATE 2 GM/50ML IV SOLN
2.0000 g | Freq: Once | INTRAVENOUS | Status: AC
  Administered 2023-01-26: 2 g via INTRAVENOUS
  Filled 2023-01-26: qty 50

## 2023-01-26 MED ORDER — INSULIN ASPART 100 UNIT/ML IJ SOLN
0.0000 [IU] | Freq: Every day | INTRAMUSCULAR | Status: DC
  Administered 2023-01-28: 3 [IU] via SUBCUTANEOUS

## 2023-01-26 MED ORDER — ENSURE ENLIVE PO LIQD
237.0000 mL | Freq: Two times a day (BID) | ORAL | Status: DC
  Administered 2023-01-26 – 2023-01-29 (×7): 237 mL via ORAL

## 2023-01-26 MED ORDER — INSULIN ASPART 100 UNIT/ML IJ SOLN
0.0000 [IU] | Freq: Three times a day (TID) | INTRAMUSCULAR | Status: DC
  Administered 2023-01-26: 3 [IU] via SUBCUTANEOUS
  Administered 2023-01-27: 5 [IU] via SUBCUTANEOUS
  Administered 2023-01-27: 8 [IU] via SUBCUTANEOUS
  Administered 2023-01-27 – 2023-01-28 (×2): 5 [IU] via SUBCUTANEOUS
  Administered 2023-01-28: 8 [IU] via SUBCUTANEOUS
  Administered 2023-01-28: 2 [IU] via SUBCUTANEOUS
  Administered 2023-01-29: 5 [IU] via SUBCUTANEOUS
  Administered 2023-01-29: 8 [IU] via SUBCUTANEOUS

## 2023-01-26 NOTE — Progress Notes (Signed)
[facility] PROGRESS NOTE  [name] (DOB: 29-May-1950) WUJ:[id number] PCP: Patient, No Pcp Per  Brief Narrative: [name] is a 72 y.o. male with a history of T2DM, schizophrenia who presented to the ED on 01/25/2023 with dizziness/lightheadedness, suspicion for unintentionally taking risperidone more than prescribed. He'd also been eating/drinking less. He was hypotensive with AKI in the ED, admitted, started on IV fluids.   Subjective: Pt denies any symptoms at this time, but ok with me speaking with his HCPOA (per both herself and the patient), Valerie Roys, so spoke with her by phone at bedside. He has lived part time in [place] and part time in [place] helping care for his mother in her 21's with dementia since his father died in 23-Feb-2017.   Objective: BP (!) 118/59 (BP Location: Right Arm)   Pulse 87   Temp 98.2 F (36.8 C)   Resp 16   Ht 5\' 10"  (1.778 m)   Wt 90.5 kg   SpO2 97%   BMI 28.63 kg/m   Gen: No distress Pulm: Clear, nonlabored  CV: RRR, no MRG or pitting edema GI: Soft, NT, ND, +BS Neuro: Alert, interactive, cooperative, moves all extremities. Bradyphrenic. Ext: Warm, no deformities Skin: No acute-appearing rashes, lesions or ulcers on visualized skin   Assessment & Plan: AKI: SCr baseline suspected to be 0.8, has been 2.2 > 2.5 > 2.9 here. No hydronephrosis/obstructive uropathy on CT abd/pelvis. Mild NAGMA.  - Continue IVF with LR for now, monitor serial chemistry. Suspect prerenal azotemia and hypotension caused by diarrhea, possibly a cyclic decrease in risperidone clearance with progressive renal function impairment which led to decreased oral rehydration, etc.  - Check urine microscopy, dipstick in ED did not trigger reflex micro.   Diarrhea:  - Continue enteric precautions. Abd benign, no acute findings on CT. Await sample for GI pathogen panel, C. diff assays.  - Hold off on further abx for now.   Schizophrenia, acute metabolic encephalopathy:  Encephalopathy was the diagnosis at [facility] hospital admission, mentation remains abnormal with unclear actual baseline. CT head nonacute.   - Holding risperidone for now with CrCl <64ml/min and drowsy mental status. If he were to develop psychotic symptoms, would have to restart antipsychotic therapy sooner.  - Delirium precautions  T2DM: Per report, he was told that he was to take only oral medication for diabetes as there was concern for incorrect dosing at home. HbA1c is 7.9%. - Continue SSI for now, increase to moderate scale, add HS coverage. will adjust based on trends.  - Pt was only on linagliptin (per his HCPOA by phone)  Hypokalemia: Resolved with supplementation.   This patient's medical history remains unclear, though we are pursuing release of information for [facility] hospital in [place].   Tyrone Nine, MD [facility] [email or web address] 01/26/2023, 2:21 PM

## 2023-01-26 NOTE — Plan of Care (Signed)

## 2023-01-26 NOTE — Plan of Care (Signed)
  Problem: Education: Goal: Ability to describe self-care measures that may prevent or decrease complications (Diabetes Survival Skills Education) will improve Outcome: Progressing   Problem: Coping: Goal: Ability to adjust to condition or change in health will improve Outcome: Progressing   Problem: Fluid Volume: Goal: Ability to maintain a balanced intake and output will improve Outcome: Progressing   Problem: Health Behavior/Discharge Planning: Goal: Ability to identify and utilize available resources and services will improve Outcome: Progressing Goal: Ability to manage health-related needs will improve Outcome: Progressing   Problem: Metabolic: Goal: Ability to maintain appropriate glucose levels will improve Outcome: Progressing   Problem: Nutritional: Goal: Maintenance of adequate nutrition will improve Outcome: Progressing   Problem: Skin Integrity: Goal: Risk for impaired skin integrity will decrease Outcome: Progressing   Problem: Education: Goal: Knowledge of General Education information will improve Description: Including pain rating scale, medication(s)/side effects and non-pharmacologic comfort measures Outcome: Progressing   Problem: Health Behavior/Discharge Planning: Goal: Ability to manage health-related needs will improve Outcome: Progressing   Problem: Clinical Measurements: Goal: Will remain free from infection Outcome: Progressing

## 2023-01-27 DIAGNOSIS — N179 Acute kidney failure, unspecified: Secondary | ICD-10-CM | POA: Diagnosis not present

## 2023-01-27 LAB — GLUCOSE, CAPILLARY
Glucose-Capillary: 216 mg/dL — ABNORMAL HIGH (ref 70–99)
Glucose-Capillary: 212 mg/dL — ABNORMAL HIGH (ref 70–99)
Glucose-Capillary: 272 mg/dL — ABNORMAL HIGH (ref 70–99)
Glucose-Capillary: 157 mg/dL — ABNORMAL HIGH (ref 70–99)

## 2023-01-27 LAB — BLOOD CULTURE ID PANEL (REFLEXED) - BCID2

## 2023-01-27 LAB — BASIC METABOLIC PANEL
Anion gap: 9 (ref 5–15)
BUN: 33 mg/dL — ABNORMAL HIGH (ref 8–23)
CO2: 20 mmol/L — ABNORMAL LOW (ref 22–32)
Calcium: 8.3 mg/dL — ABNORMAL LOW (ref 8.9–10.3)
Chloride: 110 mmol/L (ref 98–111)
Creatinine, Ser: 2.75 mg/dL — ABNORMAL HIGH (ref 0.61–1.24)
GFR, Estimated: 24 mL/min — ABNORMAL LOW (ref >60)
Glucose, Bld: 197 mg/dL — ABNORMAL HIGH (ref 70–99)
Potassium: 4.5 mmol/L (ref 3.5–5.1)
Sodium: 139 mmol/L (ref 135–145)

## 2023-01-27 LAB — MAGNESIUM: Magnesium: 1.6 mg/dL — ABNORMAL LOW (ref 1.7–2.4)

## 2023-01-27 LAB — C DIFFICILE QUICK SCREEN W PCR REFLEX
C Diff antigen: NEGATIVE
C Diff interpretation: NOT DETECTED
C Diff toxin: NEGATIVE

## 2023-01-27 MED ORDER — RISPERIDONE 0.25 MG PO TABS
0.5000 mg | ORAL_TABLET | Freq: Every day | ORAL | Status: DC
  Administered 2023-01-27: 0.5 mg via ORAL
  Filled 2023-01-27: qty 2

## 2023-01-27 MED ORDER — LINAGLIPTIN 5 MG PO TABS
5.0000 mg | ORAL_TABLET | Freq: Every day | ORAL | Status: DC
  Administered 2023-01-27 – 2023-01-29 (×3): 5 mg via ORAL
  Filled 2023-01-27 (×3): qty 1

## 2023-01-27 MED ORDER — MAGNESIUM SULFATE 2 GM/50ML IV SOLN
2.0000 g | Freq: Once | INTRAVENOUS | Status: AC
  Administered 2023-01-27: 2 g via INTRAVENOUS
  Filled 2023-01-27: qty 50

## 2023-01-27 MED ORDER — SODIUM CHLORIDE 0.45 % IV SOLN: INTRAVENOUS | Status: DC

## 2023-01-27 NOTE — Inpatient Diabetes Management (Signed)
Inpatient Diabetes Program Recommendations  AACE/ADA: New Consensus Statement on Inpatient Glycemic Control (2015)  Target Ranges:  Prepandial:   less than 140 mg/dL      Peak postprandial:   less than 180 mg/dL (1-2 hours)      Critically ill patients:  140 - 180 mg/dL   Lab Results  Component Value Date   GLUCAP 216 (H) 01/27/2023   HGBA1C 7.9 (H) 01/25/2023    Review of Glycemic Control  Latest Reference Range & Units 01/26/23 08:47 01/26/23 12:15 01/26/23 16:12 01/26/23 21:07 01/27/23 08:10  Glucose-Capillary 70 - 99 mg/dL 161 (H) 096 (H) 045 (H) 157 (H) 216 (H)   Diabetes history: DM 2 Outpatient Diabetes medications:  Tradjenta 5 mg daily Metformin - (Unsure if patient was taking) Current orders for Inpatient glycemic control:  Novolog 0-15 units tid with meals and HS  Inpatient Diabetes Program Recommendations:    Consider adding Tradjenta 5 mg daily while in the hospital.   Thanks,  Beryl Meager, RN, BC-ADM Inpatient Diabetes Coordinator Pager [phone]  (8a-5p)

## 2023-01-27 NOTE — Plan of Care (Signed)
Plan of Care reviewed.

## 2023-01-27 NOTE — Progress Notes (Signed)
PHARMACY - PHYSICIAN COMMUNICATION CRITICAL VALUE ALERT - BLOOD CULTURE IDENTIFICATION (BCID)  [name] is an 72 y.o. male who presented to [facility] on 01/25/2023 with a chief complaint of diarrhea, dizziness.  Assessment:   Blood cx + GPC in single anaerobic bottle; BCID + Staph species. -possible contaminant vs true infection? GI/Cdiff PCR are pending.   Name of physician (or Provider) Contacted: [place]  Current antibiotics: none  Changes to prescribed antibiotics recommended:  Continue to monitor off antibiotics.  May start Vancomycin if feel antibiotic coverage needed.   Results for orders placed or performed during the hospital encounter of 01/25/23  Blood Culture ID Panel (Reflexed) (Collected: 01/25/2023  4:00 PM)  Result Value Ref Range   Enterococcus faecalis NOT DETECTED NOT DETECTED   Enterococcus Faecium NOT DETECTED NOT DETECTED   Listeria monocytogenes NOT DETECTED NOT DETECTED   Staphylococcus species DETECTED (A) NOT DETECTED   Staphylococcus aureus (BCID) NOT DETECTED NOT DETECTED   Staphylococcus epidermidis NOT DETECTED NOT DETECTED   Staphylococcus lugdunensis NOT DETECTED NOT DETECTED   Streptococcus species NOT DETECTED NOT DETECTED   Streptococcus agalactiae NOT DETECTED NOT DETECTED   Streptococcus pneumoniae NOT DETECTED NOT DETECTED   Streptococcus pyogenes NOT DETECTED NOT DETECTED   A.calcoaceticus-baumannii NOT DETECTED NOT DETECTED   Bacteroides fragilis NOT DETECTED NOT DETECTED   Enterobacterales NOT DETECTED NOT DETECTED   Enterobacter cloacae complex NOT DETECTED NOT DETECTED   Escherichia coli NOT DETECTED NOT DETECTED   Klebsiella aerogenes NOT DETECTED NOT DETECTED   Klebsiella oxytoca NOT DETECTED NOT DETECTED   Klebsiella pneumoniae NOT DETECTED NOT DETECTED   Proteus species NOT DETECTED NOT DETECTED   Salmonella species NOT DETECTED NOT DETECTED   Serratia marcescens NOT DETECTED NOT DETECTED   Haemophilus influenzae NOT  DETECTED NOT DETECTED   Neisseria meningitidis NOT DETECTED NOT DETECTED   Pseudomonas aeruginosa NOT DETECTED NOT DETECTED   Stenotrophomonas maltophilia NOT DETECTED NOT DETECTED   Candida albicans NOT DETECTED NOT DETECTED   Candida auris NOT DETECTED NOT DETECTED   Candida glabrata NOT DETECTED NOT DETECTED   Candida krusei NOT DETECTED NOT DETECTED   Candida parapsilosis NOT DETECTED NOT DETECTED   Candida tropicalis NOT DETECTED NOT DETECTED   Cryptococcus neoformans/gattii NOT DETECTED NOT DETECTED    Junita Push PharmD 01/27/2023  6:33 AM

## 2023-01-27 NOTE — Progress Notes (Signed)
[facility] PROGRESS NOTE  [name] (DOB: Jun 12, 1950) WUJ:[id number] PCP: Patient, No Pcp Per  Brief Narrative: [name] is a 72 y.o. male with a history of T2DM, schizophrenia who presented to the ED on 01/25/2023 with dizziness/lightheadedness, suspicion for unintentionally taking risperidone more than prescribed. He'd also been eating/drinking less. He was hypotensive with AKI in the ED, admitted, started on IV fluids.   Subjective: Pt feels a bit more clear headed today, remembers me and wants to get up OOB today. He is still having some diarrhea but no abd pain. Does not report any urinary symptoms. No fevers or chills or wounds.   Objective: BP 127/73 (BP Location: Right Arm)   Pulse 86   Temp 98.2 F (36.8 C) (Oral)   Resp 18   Ht 5\' 10"  (1.778 m)   Wt 90.5 kg   SpO2 95%   BMI 28.63 kg/m   Gen: No distress resting quietly Pulm: Clear, nonlabored  CV: RRR, no MRG or pitting edema laying supine GI: Soft, NT, ND, +BS Neuro: Alert and oriented, forgetful but much more interactive than yesterday. No new focal deficits. Ext: Warm, no deformities Skin: No rashes, lesions or ulcers on visualized skin   Assessment & Plan: AKI: SCr baseline suspected to be 0.8, has been 2.2 > 2.5 > 2.9 > 2.7 here. No hydronephrosis/obstructive uropathy on CT abd/pelvis. Mild NAGMA.  - Continue the IVF, change to maintenance for now, monitor serial chemistry. Suspect prerenal azotemia and hypotension caused by diarrhea, possibly a cyclic decrease in risperidone clearance with progressive renal function impairment which led to decreased oral rehydration, etc.  - Urine micro without casts or RBCs. 6-10 WBCs, rare bacteria, no urinary symptoms reported, will ask pt directly.    Diarrhea:  - Still having some, though less. C. diff negative, GI panel pending, so will continue enteric precautions. Abd benign, no acute findings on CT.  - Hold off on further abx for now.   Staphylococcus  spp in 1 of 4 blood cultures on admission: Pt remains afebrile, WBC was 7.9k, no cutaneous or other source of infection at this time.  - As long as he remains stable, we will treat this as contamination. Vancomycin would be high risk medication with his renal impairment.   Schizophrenia, acute metabolic encephalopathy: Encephalopathy was the diagnosis at [facility] hospital admission, mentation remains abnormal with unclear actual baseline. CT head nonacute.   - Held risperidone with lethargy and renal impairment. Lethargy is improving, so may be able to restart risperidone tonight (reports high risk of psychosis if held), will try lower dose.  - Delirium precautions - Will speak with his HCPOA daily. Called without answer today.  T2DM: Per report, he was told that he was to take only oral medication for diabetes as there was concern for incorrect dosing at home. HbA1c is 7.9%. - Continue moderate SSI with HS coverage. will adjust based on trends.  - Pt was only on linagliptin (per his HCPOA by phone), restart.   Hypokalemia: Resolved with supplementation.   Hypomagnesemia:  - Supplement  Deconditioning:  - PT/OT, get up OOB every shift.  Tyrone Nine, MD [facility] [email or web address] 01/27/2023, 1:12 PM

## 2023-01-27 NOTE — TOC Progression Note (Signed)
Transition of Care [facility]) - Progression Note    Patient Details  Name: [name] MRN: [id number] Date of Birth: [birth year]  Transition of Care [facility]) CM/SW Contact  Harriett Sine, RN Phone Number:[phone]  01/27/2023, 12:37 PM  Clinical Narrative:    Pt from home, pt states her pcp  is Earnstine Regal here in [place]. Spoke with pt at bedside about care support at home, [facility] agency and d/c plans. Pt states she can't remember HH agency at the moment and she does have care support at home after d/c. SDOH needs id. Food and transportation resources added AVS packet. TOC following   Expected Discharge Plan: Home w Home Health Services Barriers to Discharge: No Barriers Identified  Expected Discharge Plan and Services In-house Referral: NA Discharge Planning Services: NA   Living arrangements for the past 2 months: Single Family Home                                       Social Determinants of Health (SDOH) Interventions SDOH Screenings   Food Insecurity: Food Insecurity Present (01/25/2023)  Housing: Low Risk  (01/25/2023)  Transportation Needs: Unmet Transportation Needs (01/25/2023)  Utilities: Not At Risk (01/25/2023)  Depression (PHQ2-9): Medium Risk (10/15/2021)  Tobacco Use: Low Risk  (01/25/2023)    Readmission Risk Interventions     No data to display

## 2023-01-27 NOTE — Evaluation (Addendum)
Physical Therapy Evaluation Only Patient Details Name: [name] MRN: [id number] DOB: January 03, 1951 Today's Date: 01/27/2023  History of Present Illness  [name] is a 72 y.o. male admitted with AKI. PMH: DM, schizophrenia  Clinical Impression  Pt ind with transfers and ambulation, no AD, good steadiness without overt LOB. Pt ind at baseline, no AD, lives at home to assist caring for elderly mother, but also with nurse aides in the home assisting mother as needed. Pt able to amb further but IV Pole alarming to be plugged in so returned to room. encouraged pt to ambulate in hallway as able with nursing and mobility and pt verbalized agreement. No acute PT needs identified, will sign off at this time.        If plan is discharge home, recommend the following:     Can travel by private vehicle        Equipment Recommendations None recommended by PT  Recommendations for Other Services       Functional Status Assessment Patient has not had a recent decline in their functional status     Precautions / Restrictions Precautions Precaution Comments: enteric precautions Restrictions Weight Bearing Restrictions: No      Mobility  Bed Mobility Overal bed mobility: Independent                  Transfers Overall transfer level: Modified independent Equipment used: None               General transfer comment: BUE assisting to power up    Ambulation/Gait Ambulation/Gait assistance: Modified independent (Device/Increase time) Gait Distance (Feet): 200 Feet Assistive device: IV Pole Gait Pattern/deviations: WFL(Within Functional Limits) Gait velocity: WFL     General Gait Details: pt navigates hallway steering IV Pole without difficulty, clearing past obstacles and completing 90 degree turns, no overt LOB or unsteadiness noted  Stairs            Wheelchair Mobility     Tilt Bed    Modified Rankin (Stroke Patients Only)       Balance Overall  balance assessment: No apparent balance deficits (not formally assessed)                                           Pertinent Vitals/Pain Pain Assessment Pain Assessment: No/denies pain    Home Living Family/patient expects to be discharged to:: Private residence Living Arrangements: Alone;Parent Available Help at Discharge: Family Type of Home: House Home Access: Stairs to enter;Ramped entrance   Entrance Stairs-Number of Steps: 3-4 at front door, ramp at garage door   Home Layout: Two level;Able to live on main level with bedroom/bathroom Home Equipment: None      Prior Function Prior Level of Function : Independent/Modified Independent             Mobility Comments: pt reports ind ADLs Comments: pt reports ind with self care, small house chores; lives with elederly mother who has multiple caregivers that also perform household chores     Extremity/Trunk Assessment   Upper Extremity Assessment Upper Extremity Assessment: Overall WFL for tasks assessed    Lower Extremity Assessment Lower Extremity Assessment: Overall WFL for tasks assessed    Cervical / Trunk Assessment Cervical / Trunk Assessment: Normal  Communication   Communication Communication: No apparent difficulties  Cognition Arousal: Alert Behavior During Therapy: WFL for tasks assessed/performed Overall Cognitive Status:  Within Functional Limits for tasks assessed                                          General Comments      Exercises     Assessment/Plan    PT Assessment Patient does not need any further PT services  PT Problem List         PT Treatment Interventions      PT Goals (Current goals can be found in the Care Plan section)  Acute Rehab PT Goals Patient Stated Goal: return home PT Goal Formulation: All assessment and education complete, DC therapy    Frequency       Co-evaluation               AM-PAC PT "6 Clicks" Mobility   Outcome Measure Help needed turning from your back to your side while in a flat bed without using bedrails?: None Help needed moving from lying on your back to sitting on the side of a flat bed without using bedrails?: None Help needed moving to and from a bed to a chair (including a wheelchair)?: None Help needed standing up from a chair using your arms (e.g., wheelchair or bedside chair)?: None Help needed to walk in hospital room?: None Help needed climbing 3-5 steps with a railing? : None 6 Click Score: 24    End of Session   Activity Tolerance: Patient tolerated treatment well Patient left: in chair;with call bell/phone within reach;with chair alarm set Nurse Communication: Mobility status      Time: 1323-1350 PT Time Calculation (min) (ACUTE ONLY): 27 min   Charges:   PT Evaluation $PT Eval Low Complexity: 1 Low PT Treatments $Gait Training: 8-22 mins PT General Charges $$ ACUTE PT VISIT: 1 Visit         Tori Lakelyn Straus PT, DPT 01/27/23, 2:10 PM

## 2023-01-28 ENCOUNTER — Other Ambulatory Visit (HOSPITAL_COMMUNITY): Payer: Self-pay

## 2023-01-28 DIAGNOSIS — N179 Acute kidney failure, unspecified: Secondary | ICD-10-CM | POA: Diagnosis not present

## 2023-01-28 LAB — COMPREHENSIVE METABOLIC PANEL
ALT: 15 U/L (ref 0–44)
AST: 14 U/L — ABNORMAL LOW (ref 15–41)
Albumin: 3.3 g/dL — ABNORMAL LOW (ref 3.5–5.0)
Alkaline Phosphatase: 70 U/L (ref 38–126)
Anion gap: 7 (ref 5–15)
BUN: 30 mg/dL — ABNORMAL HIGH (ref 8–23)
CO2: 21 mmol/L — ABNORMAL LOW (ref 22–32)
Calcium: 8.1 mg/dL — ABNORMAL LOW (ref 8.9–10.3)
Chloride: 106 mmol/L (ref 98–111)
Creatinine, Ser: 1.66 mg/dL — ABNORMAL HIGH (ref 0.61–1.24)
GFR, Estimated: 44 mL/min — ABNORMAL LOW (ref >60)
Glucose, Bld: 223 mg/dL — ABNORMAL HIGH (ref 70–99)
Potassium: 4.1 mmol/L (ref 3.5–5.1)
Sodium: 134 mmol/L — ABNORMAL LOW (ref 135–145)
Total Bilirubin: 0.7 mg/dL (ref 0.3–1.2)
Total Protein: 5.8 g/dL — ABNORMAL LOW (ref 6.5–8.1)

## 2023-01-28 LAB — GASTROINTESTINAL PANEL BY PCR, STOOL (REPLACES STOOL CULTURE)

## 2023-01-28 LAB — CULTURE, BLOOD (SINGLE): Culture  Setup Time: NO GROWTH

## 2023-01-28 LAB — GLUCOSE, CAPILLARY
Glucose-Capillary: 196 mg/dL — ABNORMAL HIGH (ref 70–99)
Glucose-Capillary: 266 mg/dL — ABNORMAL HIGH (ref 70–99)
Glucose-Capillary: 234 mg/dL — ABNORMAL HIGH (ref 70–99)
Glucose-Capillary: 192 mg/dL — ABNORMAL HIGH (ref 70–99)

## 2023-01-28 MED ORDER — RISPERIDONE 1 MG PO TABS
2.0000 mg | ORAL_TABLET | Freq: Every day | ORAL | Status: DC
  Administered 2023-01-28: 2 mg via ORAL
  Filled 2023-01-28: qty 2

## 2023-01-28 MED ORDER — SODIUM CHLORIDE 0.45 % IV SOLN: INTRAVENOUS | Status: AC

## 2023-01-28 NOTE — Progress Notes (Signed)
Epic chat sent to Melvyn Neth, CM & Jarvis Newcomer, MD. Filiberto Pinks called she is requesting PT/OT/ST (cognitive therapy) with HH at discharge. She is also asking for patient to be sent for a cognitive test outpatient.

## 2023-01-28 NOTE — Plan of Care (Signed)
  Problem: Education: Goal: Ability to describe self-care measures that may prevent or decrease complications (Diabetes Survival Skills Education) will improve Outcome: Progressing Goal: Individualized Educational Video(s) Outcome: Progressing   Problem: Coping: Goal: Ability to adjust to condition or change in health will improve Outcome: Progressing   Problem: Fluid Volume: Goal: Ability to maintain a balanced intake and output will improve Outcome: Progressing   Problem: Coping: Goal: Ability to adjust to condition or change in health will improve Outcome: Progressing

## 2023-01-28 NOTE — Progress Notes (Signed)
OT Cancellation Note  Patient Details Name: [name] MRN: [id number] DOB: 06-23-1950   Cancelled Treatment:    Reason Eval/Treat Not Completed: OT screened, no needs identified, will sign off.    Reuben Likes, OTR/L 01/28/2023, 8:59 AM

## 2023-01-28 NOTE — Progress Notes (Addendum)
[facility] PROGRESS NOTE  Adelfo Diebel (DOB: Feb 19, 1951) ZOX:[id number] PCP: Patient, No Pcp Per  Brief Narrative: Gleason Ardoin is a 72 y.o. male with a history of T2DM, schizophrenia who presented to the ED on 01/25/2023 with dizziness/lightheadedness, suspicion for unintentionally taking risperidone more than prescribed. He'd also been eating/drinking less. He was hypotensive with AKI in the ED, admitted, started on IV fluids.   Subjective: Renal function improved, urine output subjectively normal with no urinary complaints. No fevers. He's eager to get OOB more. Still having diarrhea.   Objective: BP (!) 117/58 (BP Location: Left Arm)   Pulse 79   Temp 97.8 F (36.6 C)   Resp 16   Ht 5\' 10"  (1.778 m)   Wt 90.5 kg   SpO2 97%   BMI 28.63 kg/m   Gen: No distress Pulm: Clear, nonlabored  CV: RRR, no MRG GI: Soft, NT, ND, +BS  Neuro: Rousable and oriented. Didn't remember me/my name, but is more interactive. No new focal deficits. Ext: Warm, no deformities Skin: No rashes, lesions or ulcers on visualized skin   Assessment & Plan: AKI: SCr baseline suspected to be 0.8, has been 2.2 > 2.5 > 2.9 > 2.7 here. No hydronephrosis/obstructive uropathy on CT abd/pelvis. Mild NAGMA.  - Continue the IVF at maintenance rate as his oral intake is picking up as his mentation clears. Still having some diarrhea. Suspect prerenal azotemia and hypotension caused by diarrhea, possibly a cyclic decrease in risperidone clearance with progressive renal function impairment which led to decreased oral rehydration, etc.  - Urine micro without casts or RBCs. 6-10 WBCs, rare bacteria, no urinary symptoms per pt.  Diarrhea:  - Still having some, though less. C. diff negative, GI panel pending, so will continue enteric precautions. Abd benign, no acute findings on CT.  - Holding off on further abx for now. If GI panel negative, would start imodium.  - Would consider stopping metformin going forward  if this is linked to diarrhea.  Staphylococcus spp in 1 of 4 blood cultures on admission: Pt remains afebrile, WBC was 7.9k, no cutaneous or other source of infection at this time.  - As long as he remains stable, we will treat this as contamination. Vancomycin would be high risk medication with his renal impairment.   Schizophrenia, acute metabolic encephalopathy: Encephalopathy was the diagnosis at [facility] hospital admission, mentation is improving. CT head nonacute.   - Held risperidone with lethargy and renal impairment. Lethargy improved, so restarted lower dose. With CrCl now 63ml/min, will restart home dose and monitor mentation.  - Delirium precautions - Spoke with his Judie Petit on 10/13, called daily since then without answer. Update: was able to get her by phone today.  T2DM: Per report, he was told that he was to take only oral medication for diabetes as there was concern for incorrect dosing at home. HbA1c is 7.9%. - Continue moderate SSI with HS coverage. will adjust based on trends.  - Pt was only on linagliptin (per his HCPOA by phone), restarted.   Hypokalemia: Resolved with supplementation.   Hypomagnesemia:  - Supplemented  Deconditioning: This is improving steadily as renal function improves and mentation clears.   Tyrone Nine, MD [facility] [email or web address] 01/28/2023, 11:23 AM

## 2023-01-28 NOTE — Progress Notes (Signed)
Mobility Specialist - Progress Note   01/28/23 0954  Mobility  Activity Ambulated with assistance in hallway  Level of Assistance Standby assist, set-up cues, supervision of patient - no hands on  Assistive Device None  Distance Ambulated (ft) 700 ft  Range of Motion/Exercises Active  Activity Response Tolerated well  Mobility Referral Yes  $Mobility charge 1 Mobility  Mobility Specialist Start Time (ACUTE ONLY) 0925  Mobility Specialist Stop Time (ACUTE ONLY) 0945  Mobility Specialist Time Calculation (min) (ACUTE ONLY) 20 min   Pt received in bed and agreed to mobility, had no issues throughout session, returned to chair with all needs met.  Marilynne Halsted Mobility Specialist

## 2023-01-28 NOTE — Plan of Care (Signed)
Plan of Care reviewed.

## 2023-01-28 NOTE — TOC Progression Note (Signed)
Transition of Care [facility]) - Progression Note    Patient Details  Name: [name] MRN: [id number] Date of Birth: 04-Sep-1950  [facility]) CM/SW Contact  Harriett Sine, RN Phone Number: 01/28/2023, 3:18 PM  Clinical Narrative:    Spoke with pt at bedside about [facility] and d/c plans. Pt was given [facility] list. Pt was not sure if he had HHRN at home. Called Filiberto Pinks [phone]. Spoke about HCPOA needed documents. [name] agreed to send contact person 's name and information for [facility] agency the pt wants to handle PT/OT. Explained to pt and [name] that the cognitive exams would need to be ordered by the pcp after d/c. [name] asked to have d/c summary faxed her. It was explained that the pt does not have HH services it is his mother's [facility] nurse. TOC following   Expected Discharge Plan: Home w Home Health Services Barriers to Discharge: No Barriers Identified  Expected Discharge Plan and Services In-house Referral: NA Discharge Planning Services: NA   Living arrangements for the past 2 months: Single Family Home                                       Social Determinants of Health (SDOH) Interventions SDOH Screenings   Food Insecurity: Food Insecurity Present (01/25/2023)  Housing: Low Risk  (01/25/2023)  Transportation Needs: Unmet Transportation Needs (01/25/2023)  Utilities: Not At Risk (01/25/2023)  Depression (PHQ2-9): Medium Risk (10/15/2021)  Tobacco Use: Low Risk  (01/25/2023)    Readmission Risk Interventions     No data to display

## 2023-01-29 DIAGNOSIS — N179 Acute kidney failure, unspecified: Secondary | ICD-10-CM | POA: Diagnosis not present

## 2023-01-29 LAB — BASIC METABOLIC PANEL
Anion gap: 9 (ref 5–15)
BUN: 34 mg/dL — ABNORMAL HIGH (ref 8–23)
CO2: 24 mmol/L (ref 22–32)
Calcium: 8.2 mg/dL — ABNORMAL LOW (ref 8.9–10.3)
Chloride: 103 mmol/L (ref 98–111)
Creatinine, Ser: 1.35 mg/dL — ABNORMAL HIGH (ref 0.61–1.24)
GFR, Estimated: 56 mL/min — ABNORMAL LOW (ref >60)
Glucose, Bld: 210 mg/dL — ABNORMAL HIGH (ref 70–99)
Potassium: 4 mmol/L (ref 3.5–5.1)
Sodium: 136 mmol/L (ref 135–145)

## 2023-01-29 LAB — GLUCOSE, CAPILLARY
Glucose-Capillary: 232 mg/dL — ABNORMAL HIGH (ref 70–99)
Glucose-Capillary: 253 mg/dL — ABNORMAL HIGH (ref 70–99)

## 2023-01-29 NOTE — Plan of Care (Signed)

## 2023-01-29 NOTE — Discharge Summary (Signed)
Physician Discharge Summary   Patient: [name] MRN: [id number] DOB: [birth year]  Admit date:     01/25/2023  Discharge date: 01/29/23  Discharge Physician: Tyrone Nine   PCP: Patient, No Pcp Per   Recommendations at discharge:  Establish care with PCP locally, continue follow up for diabetes, continue linagliptin. Recheck renal function.  Follow up with psychiatry per routine Recommend outpatient neuropsychiatry evaluation given concern for progressive cognitive dysfunction.   Discharge Diagnoses: Principal Problem:   AKI (acute kidney injury) (HCC) Active Problems:   Diarrhea   Schizophrenia (HCC)   DM (diabetes mellitus) [facility])  Hospital Course: [name] is a 72 y.o. male with a history of T2DM, schizophrenia who presented to the ED on 01/25/2023 with dizziness/lightheadedness, suspicion for unintentionally taking risperidone more than prescribed. He'd also been eating/drinking less and having diarrhea. He was hypotensive with AKI in the ED, admitted, started on IV fluids. GI work up showed no infectious etiology. With IVF and supportive care, mental status has improved dramatically and AKI continues improvement. He's taking adequate hydration/nutrition enterally and stable for discharge.   Assessment and Plan: AKI: SCr baseline suspected to be 0.8, peaked at 2.9 here, subsequently sustaining improvement down to 1.35 so far here. Anticipate continued improvement now that prerenal azotemia improved. No hydronephrosis/obstructive uropathy on CT abd/pelvis. Mild NAGMA which may be due to the diarrhea. Urine micro without casts or RBCs. 6-10 WBCs, rare bacteria, no urinary symptoms per pt. - Diarrhea improved, taking adequate oral intake, suggest recheck BMP at follow up.    Diarrhea: Improved, negative C. diff and GI pathogen panel.  - Hold metformin as the dose increase may have caused GI symptoms.     Staphylococcus capitis in 1 of 4 blood cultures on admission: Pt  remains afebrile, WBC 7.9k, no cutaneous or other source of infection at this time. We will treat this as contamination. Other cultures remain without growth at 4 days at time of discharge.   Schizophrenia, acute metabolic encephalopathy: Encephalopathy was the diagnosis at [facility] hospital admission, mentation is improving. CT head nonacute. Discharge summary reviewed. - Held risperidone with lethargy and renal impairment. Lethargy improved, so restarted lower dose. With CrCl now 20ml/min, we've restarted home dose with stable mental status. Fortunately, no psychosis at this time.  - Recommend outpatient neuropsychiatric evaluation given a more subacute cognitive impairment. Home health referrals made prior to discharge.    T2DM: Per report, he was told that he was to take only oral medication for diabetes as there was concern for incorrect dosing at home. HbA1c is 7.9%. - Continue monotherapy with linagliptin and PCP follow up.     Hypokalemia: Resolved with supplementation.    Hypomagnesemia: Supplemented   Deconditioning: This is improving steadily as renal function improves and mentation clears.    Consultants: None Procedures performed: None  Disposition: Home Diet recommendation: Carb-modified DISCHARGE MEDICATION: Allergies as of 01/29/2023   No Known Allergies      Medication List     STOP taking these medications    METFORMIN HCL PO       TAKE these medications    atorvastatin 20 MG tablet Commonly known as: LIPITOR Take 1 tablet (20 mg total) by mouth daily. Says has not been taking   Fenofibric Acid 135 MG Cpdr Take 135 mg by mouth daily.   gabapentin 300 MG capsule Commonly known as: NEURONTIN Take 600 mg by mouth at bedtime.   risperiDONE 2 MG tablet Commonly known as: RISPERDAL Take 1 tablet (  2 mg total) by mouth at bedtime.   Tradjenta 5 MG Tabs tablet Generic drug: linagliptin Take 5 mg by mouth daily.        Follow-up Information     Primary  Care Provider. Schedule an appointment as soon as possible for a visit.                 Discharge Exam: Filed Weights   01/25/23 1526 01/29/23 0638  Weight: 90.5 kg 86.7 kg  BP 123/65 (BP Location: Right Arm)   Pulse 72   Temp 98.2 F (36.8 C)   Resp 16   Ht 5\' 10"  (1.778 m)   Wt 86.7 kg   SpO2 99%   BMI 27.43 kg/m   No distress, pleasant, alert, oriented, conversant. Ambulated around the hall again today with steady gait. Clear, nonlabored RRR, no MRG or pitting edema Soft, NT, ND, +BS  Condition at discharge: stable  The results of significant diagnostics from this hospitalization (including imaging, microbiology, ancillary and laboratory) are listed below for reference.   Imaging Studies: CT ABDOMEN PELVIS WO CONTRAST  Result Date: 01/25/2023 CLINICAL DATA:  Abdominal pain EXAM: CT ABDOMEN AND PELVIS WITHOUT CONTRAST TECHNIQUE: Multidetector CT imaging of the abdomen and pelvis was performed following the standard protocol without IV contrast. RADIATION DOSE REDUCTION: This exam was performed according to the departmental dose-optimization program which includes automated exposure control, adjustment of the mA and/or kV according to patient size and/or use of iterative reconstruction technique. COMPARISON:  None Available. FINDINGS: Lower chest: Atelectasis or scarring in the lung bases. No effusions. Hepatobiliary: Small layering gallstones within the gallbladder. No focal hepatic abnormality or biliary ductal dilatation. Pancreas: No focal abnormality or ductal dilatation. Spleen: No focal abnormality.  Normal size. Adrenals/Urinary Tract: Punctate 1 mm nonobstructing stone in the midpole of the left kidney. No renal or adrenal mass. No ureteral stones or hydronephrosis. Urinary bladder unremarkable. Stomach/Bowel: Sigmoid diverticulosis. No active diverticulitis. Stomach and small bowel decompressed, unremarkable. Normal appendix. Vascular/Lymphatic: No evidence of aneurysm  or adenopathy. Aortic atherosclerosis. Reproductive: Prostate enlargement Other: No free fluid or free air. Musculoskeletal: No acute bony abnormality. IMPRESSION: No acute findings. Cholelithiasis.  No CT evidence of acute cholecystitis. Punctate left nephrolithiasis.  No hydronephrosis. Aortic atherosclerosis. Sigmoid diverticulosis. Electronically Signed   By: Charlett Nose M.D.   On: 01/25/2023 19:18   CT HEAD WO CONTRAST ( )  Result Date: 01/25/2023 CLINICAL DATA:  Mental status change, unknown cause.  Dizziness. EXAM: CT HEAD WITHOUT CONTRAST TECHNIQUE: Contiguous axial images were obtained from the base of the skull through the vertex without intravenous contrast. RADIATION DOSE REDUCTION: This exam was performed according to the departmental dose-optimization program which includes automated exposure control, adjustment of the mA and/or kV according to patient size and/or use of iterative reconstruction technique. COMPARISON:  07/17/2022 FINDINGS: Brain: Age related volume loss. No acute intracranial abnormality. Specifically, no hemorrhage, hydrocephalus, mass lesion, acute infarction, or significant intracranial injury. Vascular: No hyperdense vessel or unexpected calcification. Skull: No acute calvarial abnormality. Sinuses/Orbits: No acute findings Other: None IMPRESSION: No acute intracranial abnormality. Electronically Signed   By: Charlett Nose M.D.   On: 01/25/2023 19:16   DG Chest Portable 1 View  Result Date: 01/25/2023 CLINICAL DATA:  Dizziness and hypotension EXAM: PORTABLE CHEST 1 VIEW COMPARISON:  Radiograph 06/02/2017 FINDINGS: Stable cardiomediastinal silhouette. Aortic atherosclerotic calcification. No focal consolidation, pleural effusion, or pneumothorax. No displaced rib fractures. IMPRESSION: No acute cardiopulmonary disease. Electronically Signed   By: Minerva Fester  M.D.   On: 01/25/2023 17:21    Microbiology: Results for orders placed or performed during the hospital  encounter of 01/25/23  Culture, blood (single)     Status: Abnormal   Collection Time: 01/25/23  4:00 PM   Specimen: BLOOD LEFT ARM  Result Value Ref Range Status   Specimen Description   Final    BLOOD LEFT ARM Performed at [facility], [address] [place], [place] [address]    Special Requests   Final    BOTTLES DRAWN AEROBIC AND ANAEROBIC Blood Culture results may not be optimal due to an excessive volume of blood received in culture bottles Performed at [facility], [address], [place], [place] [address]    Culture  Setup Time   Final    GRAM POSITIVE COCCI AEROBIC BOTTLE ONLY CRITICAL RESULT CALLED TO, READ BACK BY AND VERIFIED WITH: M LILLISTON,PHARMD@0626  01/27/23 MK    Culture (A)  Final    STAPHYLOCOCCUS CAPITIS THE SIGNIFICANCE OF ISOLATING THIS ORGANISM FROM A SINGLE VENIPUNCTURE CANNOT BE PREDICTED WITHOUT FURTHER CLINICAL AND CULTURE CORRELATION. SUSCEPTIBILITIES AVAILABLE ONLY ON REQUEST. Performed at [facility], [address], [place], [place] [address]    Report Status 01/28/2023 FINAL  Final  Blood Culture ID Panel (Reflexed)     Status: Abnormal   Collection Time: 01/25/23  4:00 PM  Result Value Ref Range Status   Enterococcus faecalis NOT DETECTED NOT DETECTED Final   Enterococcus Faecium NOT DETECTED NOT DETECTED Final   Listeria monocytogenes NOT DETECTED NOT DETECTED Final   Staphylococcus species DETECTED (A) NOT DETECTED Final    Comment: CRITICAL RESULT CALLED TO, READ BACK BY AND VERIFIED WITH: M LILLISTON,PHARMD@0626  01/27/23 MK    Staphylococcus aureus (BCID) NOT DETECTED NOT DETECTED Final   Staphylococcus epidermidis NOT DETECTED NOT DETECTED Final   Staphylococcus lugdunensis NOT DETECTED NOT DETECTED Final   Streptococcus species NOT DETECTED NOT DETECTED Final   Streptococcus agalactiae NOT DETECTED NOT DETECTED Corrected    Comment: CORRECTED ON 10/14 AT 4696: PREVIOUSLY REPORTED AS NOT  DETECTED M LILLISTON,PHARMD@0626  01/27/23 MK   Streptococcus pneumoniae NOT DETECTED NOT DETECTED Final   Streptococcus pyogenes NOT DETECTED NOT DETECTED Final   A.calcoaceticus-baumannii NOT DETECTED NOT DETECTED Final   Bacteroides fragilis NOT DETECTED NOT DETECTED Final   Enterobacterales NOT DETECTED NOT DETECTED Final   Enterobacter cloacae complex NOT DETECTED NOT DETECTED Final   Escherichia coli NOT DETECTED NOT DETECTED Final   Klebsiella aerogenes NOT DETECTED NOT DETECTED Final   Klebsiella oxytoca NOT DETECTED NOT DETECTED Final   Klebsiella pneumoniae NOT DETECTED NOT DETECTED Final   Proteus species NOT DETECTED NOT DETECTED Final   Salmonella species NOT DETECTED NOT DETECTED Final   Serratia marcescens NOT DETECTED NOT DETECTED Final   Haemophilus influenzae NOT DETECTED NOT DETECTED Final   Neisseria meningitidis NOT DETECTED NOT DETECTED Final   Pseudomonas aeruginosa NOT DETECTED NOT DETECTED Final   Stenotrophomonas maltophilia NOT DETECTED NOT DETECTED Final   Candida albicans NOT DETECTED NOT DETECTED Final   Candida auris NOT DETECTED NOT DETECTED Final   Candida glabrata NOT DETECTED NOT DETECTED Final   Candida krusei NOT DETECTED NOT DETECTED Final   Candida parapsilosis NOT DETECTED NOT DETECTED Final   Candida tropicalis NOT DETECTED NOT DETECTED Final   Cryptococcus neoformans/gattii NOT DETECTED NOT DETECTED Final    Comment: Performed at [facility], [address], [place], [place] [address]  Resp panel by RT-PCR (  RSV, Flu A&B, Covid) Anterior Nasal Swab     Status: None   Collection Time: 01/25/23  6:41 PM   Specimen: Anterior Nasal Swab  Result Value Ref Range Status   SARS Coronavirus 2 by RT PCR NEGATIVE NEGATIVE Final    Comment: (NOTE) SARS-CoV-2 target nucleic acids are NOT DETECTED.  The SARS-CoV-2 RNA is generally detectable in upper respiratory specimens during the acute phase of infection. The lowest concentration of SARS-CoV-2  viral copies this assay can detect is 138 copies/mL. A negative result does not preclude SARS-Cov-2 infection and should not be used as the sole basis for treatment or other patient management decisions. A negative result may occur with  improper specimen collection/handling, submission of specimen other than nasopharyngeal swab, presence of viral mutation(s) within the areas targeted by this assay, and inadequate number of viral copies(<138 copies/mL). A negative result must be combined with clinical observations, patient history, and epidemiological information. The expected result is Negative.  Fact Sheet for Patients:  BloggerCourse.com  Fact Sheet for Healthcare Providers:  SeriousBroker.it  This test is no t yet approved or cleared by the [place] FDA and  has been authorized for detection and/or diagnosis of SARS-CoV-2 by FDA under an Emergency Use Authorization (EUA). This EUA will remain  in effect (meaning this test can be used) for the duration of the COVID-19 declaration under Section 564(b)(1) of the Act, 21 U.S.C.section 360bbb-3(b)(1), unless the authorization is terminated  or revoked sooner.       Influenza A by PCR NEGATIVE NEGATIVE Final   Influenza B by PCR NEGATIVE NEGATIVE Final    Comment: (NOTE) The Xpert Xpress SARS-CoV-2/FLU/RSV plus assay is intended as an aid in the diagnosis of influenza from Nasopharyngeal swab specimens and should not be used as a sole basis for treatment. Nasal washings and aspirates are unacceptable for Xpert Xpress SARS-CoV-2/FLU/RSV testing.  Fact Sheet for Patients: BloggerCourse.com  Fact Sheet for Healthcare Providers: SeriousBroker.it  This test is not yet approved or cleared by the [place] FDA and has been authorized for detection and/or diagnosis of SARS-CoV-2 by FDA under an Emergency Use Authorization  (EUA). This EUA will remain in effect (meaning this test can be used) for the duration of the COVID-19 declaration under Section 564(b)(1) of the Act, 21 U.S.C. section 360bbb-3(b)(1), unless the authorization is terminated or revoked.     Resp Syncytial Virus by PCR NEGATIVE NEGATIVE Final    Comment: (NOTE) Fact Sheet for Patients: BloggerCourse.com  Fact Sheet for Healthcare Providers: SeriousBroker.it  This test is not yet approved or cleared by the [place] FDA and has been authorized for detection and/or diagnosis of SARS-CoV-2 by FDA under an Emergency Use Authorization (EUA). This EUA will remain in effect (meaning this test can be used) for the duration of the COVID-19 declaration under Section 564(b)(1) of the Act, 21 U.S.C. section 360bbb-3(b)(1), unless the authorization is terminated or revoked.  Performed at [facility], [address] [place], [place] [address]   C Difficile Quick Screen w PCR reflex     Status: None   Collection Time: 01/27/23  8:45 AM   Specimen: STOOL  Result Value Ref Range Status   C Diff antigen NEGATIVE NEGATIVE Final   C Diff toxin NEGATIVE NEGATIVE Final   C Diff interpretation No C. difficile detected.  Final    Comment: Performed at [facility], [address], [place], [place] [address]  Gastrointestinal Panel by PCR , Stool  Status: None   Collection Time: 01/27/23  8:45 AM   Specimen: STOOL  Result Value Ref Range Status   Campylobacter species NOT DETECTED NOT DETECTED Final   Plesimonas shigelloides NOT DETECTED NOT DETECTED Final   Salmonella species NOT DETECTED NOT DETECTED Final   Yersinia enterocolitica NOT DETECTED NOT DETECTED Final   Vibrio species NOT DETECTED NOT DETECTED Final   Vibrio cholerae NOT DETECTED NOT DETECTED Final   Enteroaggregative E coli (EAEC) NOT DETECTED NOT DETECTED Final   Enteropathogenic E coli  (EPEC) NOT DETECTED NOT DETECTED Final   Enterotoxigenic E coli (ETEC) NOT DETECTED NOT DETECTED Final   Shiga like toxin producing E coli (STEC) NOT DETECTED NOT DETECTED Final   Shigella/Enteroinvasive E coli (EIEC) NOT DETECTED NOT DETECTED Final   Cryptosporidium NOT DETECTED NOT DETECTED Final   Cyclospora cayetanensis NOT DETECTED NOT DETECTED Final   Entamoeba histolytica NOT DETECTED NOT DETECTED Final   Giardia lamblia NOT DETECTED NOT DETECTED Final   Adenovirus F40/41 NOT DETECTED NOT DETECTED Final   Astrovirus NOT DETECTED NOT DETECTED Final   Norovirus GI/GII NOT DETECTED NOT DETECTED Final   Rotavirus A NOT DETECTED NOT DETECTED Final   Sapovirus (I, II, IV, and V) NOT DETECTED NOT DETECTED Final    Comment: Performed at [facility], [address] [place], [place] [address]    Labs: CBC: Recent Labs  Lab 01/25/23 1655  WBC 7.9  NEUTROABS 5.7  HGB 11.1*  HCT 34.7*  MCV 90.8  PLT 312   Basic Metabolic Panel: Recent Labs  Lab 01/25/23 1800 01/26/23 0432 01/27/23 0409 01/28/23 0424 01/29/23 0413  NA 143 140 139 134* 136  K 3.2* 4.5 4.5 4.1 4.0  CL 117* 112* 110 106 103  CO2 17* 20* 20* 21* 24  GLUCOSE 129* 147* 197* 223* 210*  BUN 31* 36* 33* 30* 34*  CREATININE 2.55* 2.97* 2.75* 1.66* 1.35*  CALCIUM 6.7* 8.6* 8.3* 8.1* 8.2*  MG 1.2* 1.6* 1.6*  --   --    Liver Function Tests: Recent Labs  Lab 01/25/23 1655 01/26/23 0432 01/28/23 0424  AST 12* 11* 14*  ALT 12 15 15   ALKPHOS 50 68 70  BILITOT 0.5 0.6 0.7  PROT 4.4* 5.7* 5.8*  ALBUMIN 2.5* 3.4* 3.3*   CBG: Recent Labs  Lab 01/28/23 0751 01/28/23 1218 01/28/23 1638 01/28/23 2116 01/29/23 0808  GLUCAP 196* 266* 234* 192* 232*    Discharge time spent: greater than 30 minutes.  Signed: Tyrone Nine, MD [facility] 01/29/2023

## 2023-01-29 NOTE — Progress Notes (Signed)
Mobility Specialist - Progress Note   01/29/23 0800  Mobility  Activity Ambulated with assistance in hallway  Level of Assistance Modified independent, requires aide device or extra time  Assistive Device None (iv pole)  Distance Ambulated (ft) 350 ft  Range of Motion/Exercises Active  Activity Response Tolerated well  Mobility Referral Yes  $Mobility charge 1 Mobility  Mobility Specialist Start Time (ACUTE ONLY) 0900  Mobility Specialist Stop Time (ACUTE ONLY) 0911  Mobility Specialist Time Calculation (min) (ACUTE ONLY) 11 min   Pt received in bed and agreed to mobility, had no issues throughout session and returned to bed with all needs met.  Marilynne Halsted Mobility Specialist

## 2023-01-29 NOTE — TOC Transition Note (Addendum)
Transition of Care [facility]) - CM/SW Discharge Note   Patient Details  Name: [name] MRN: [id number] Date of Birth: 04-07-1951  Transition of [facility]) CM/SW Contact:  Harriett Sine, RN Phone Number: 01/29/2023, 1:58 PM   Clinical Narrative:    Ewing Schlein [phone] about d/c plans and transport. She requested Safe transport, NCM explained it was not available. [name] stated pt uses a [facility] service at [phone]. Called Daniel and scheduled pickup for pt. transport home.   Call [facility] agencies for PT/OT none available for pt based on insurance at this time [facility], [facility],  [facility], [facility], and [facility]) Left messages with [facility], [facility], and [facility].   Pt information faxed to [facility] at [phone] attn Darnelle. [facility] local representative Tresa Endo called to state they can take the pt today with PT/OT/RN. Tresa Endo was given [employer] contact information. Taxi service rescheduled for 3:30pm pickup for transport home .  Called Nash Mantis to verify address at [address] [place] [address] and update her on d/c today, left message.  [name] returned call and was updated on d/c for today  by taxi service and issue with insurance by [facility] agencies.  Final next level of care: Home w Home Health Services Barriers to Discharge: No Home Care Agency will accept this patient   Patient Goals and CMS Choice CMS Medicare.gov Compare Post Acute Care list provided to:: Patient Represenative (must comment) Filiberto Pinks [phone]) Choice offered to / list presented to : Patient, [facility] POA / Guardian  Discharge Placement                  Patient to be transferred to facility by: [facility] taxi service [phone] Name of family member notified: Filiberto Pinks [phone] Patient and family notified of of transfer: 01/29/23  Discharge Plan and Services Additional resources added to the After Visit Summary for   In-house Referral:  NA Discharge Planning Services: NA                Date DME Agency Contacted:  (none accepting)                Social Determinants of Health (SDOH) Interventions SDOH Screenings   Food Insecurity: Food Insecurity Present (01/25/2023)  Housing: Low Risk  (01/25/2023)  Transportation Needs: Unmet Transportation Needs (01/25/2023)  Utilities: Not At Risk (01/25/2023)  Depression (PHQ2-9): Medium Risk (10/15/2021)  Tobacco Use: Low Risk  (01/25/2023)     Readmission Risk Interventions     No data to display

## 2023-01-29 NOTE — TOC Progression Note (Addendum)
Transition of Care [facility]) - Progression Note    Patient Details  Name: [name] MRN: [id number] Date of Birth: 1951-01-20  Transition of Care [facility]) CM/SW Contact  Harriett Sine, RN Phone Number: 01/29/2023, 9:09 AM  Clinical Narrative:    Received POA documents from Filiberto Pinks [phone]. Called [facility]  about HHPT/OT services, they don't provide these services . Emailed [name] about names of HH agencies available on [employer].gov site and list given to pt yesterday. No response from [place].  Called [name] about HHPT and d/c transportation. She emailed 3 HHPT/OT choices and a taxi service for d/c [facility] [phone]   Expected Discharge Plan: Home w Home Health Services Barriers to Discharge: No Barriers Identified  Expected Discharge Plan and Services In-house Referral: NA Discharge Planning Services: NA   Living arrangements for the past 2 months: Single Family Home                                       Social Determinants of Health (SDOH) Interventions SDOH Screenings   Food Insecurity: Food Insecurity Present (01/25/2023)  Housing: Low Risk  (01/25/2023)  Transportation Needs: Unmet Transportation Needs (01/25/2023)  Utilities: Not At Risk (01/25/2023)  Depression (PHQ2-9): Medium Risk (10/15/2021)  Tobacco Use: Low Risk  (01/25/2023)    Readmission Risk Interventions     No data to display

## 2023-02-01 ENCOUNTER — Emergency Department (HOSPITAL_COMMUNITY)
Admission: EM | Admit: 2023-02-01 | Discharge: 2023-02-01 | Discharge status: Against Medical Advice | Payer: 59 | Attending: Emergency Medicine | Admitting: Emergency Medicine

## 2023-02-01 ENCOUNTER — Other Ambulatory Visit: Payer: Self-pay

## 2023-02-01 ENCOUNTER — Encounter (HOSPITAL_COMMUNITY): Payer: Self-pay

## 2023-02-01 DIAGNOSIS — Z5321 Procedure and treatment not carried out due to patient leaving prior to being seen by health care provider: Secondary | ICD-10-CM | POA: Insufficient documentation

## 2023-02-01 DIAGNOSIS — R110 Nausea: Secondary | ICD-10-CM | POA: Insufficient documentation

## 2023-02-01 DIAGNOSIS — E119 Type 2 diabetes mellitus without complications: Secondary | ICD-10-CM | POA: Diagnosis not present

## 2023-02-01 LAB — COMPREHENSIVE METABOLIC PANEL
ALT: 65 U/L — ABNORMAL HIGH (ref 0–44)
AST: 27 U/L (ref 15–41)
Albumin: 4.2 g/dL (ref 3.5–5.0)
Alkaline Phosphatase: 94 U/L (ref 38–126)
Anion gap: 11 (ref 5–15)
BUN: 18 mg/dL (ref 8–23)
CO2: 20 mmol/L — ABNORMAL LOW (ref 22–32)
Calcium: 9.4 mg/dL (ref 8.9–10.3)
Chloride: 103 mmol/L (ref 98–111)
Creatinine, Ser: 0.98 mg/dL (ref 0.61–1.24)
GFR, Estimated: >60 mL/min (ref >60)
Glucose, Bld: 308 mg/dL — ABNORMAL HIGH (ref 70–99)
Potassium: 3.9 mmol/L (ref 3.5–5.1)
Sodium: 134 mmol/L — ABNORMAL LOW (ref 135–145)
Total Bilirubin: 0.7 mg/dL (ref 0.3–1.2)
Total Protein: 7 g/dL (ref 6.5–8.1)

## 2023-02-01 LAB — CBC WITH DIFFERENTIAL/PLATELET
Abs Immature Granulocytes: 0.03 10*3/uL (ref 0.00–0.07)
Basophils Absolute: 0 10*3/uL (ref 0.0–0.1)
Basophils Relative: 0 %
Eosinophils Absolute: 0 10*3/uL (ref 0.0–0.5)
Eosinophils Relative: 0 %
HCT: 36.5 % — ABNORMAL LOW (ref 39.0–52.0)
Hemoglobin: 12 g/dL — ABNORMAL LOW (ref 13.0–17.0)
Immature Granulocytes: 0 %
Lymphocytes Relative: 24 %
Lymphs Abs: 1.6 10*3/uL (ref 0.7–4.0)
MCH: 29.3 pg (ref 26.0–34.0)
MCHC: 32.9 g/dL (ref 30.0–36.0)
MCV: 89.2 fL (ref 80.0–100.0)
Monocytes Absolute: 0.5 10*3/uL (ref 0.1–1.0)
Monocytes Relative: 7 %
Neutro Abs: 4.7 10*3/uL (ref 1.7–7.7)
Neutrophils Relative %: 69 %
Platelets: 297 10*3/uL (ref 150–400)
RBC: 4.09 MIL/uL — ABNORMAL LOW (ref 4.22–5.81)
RDW: 13.5 % (ref 11.5–15.5)
WBC: 6.9 10*3/uL (ref 4.0–10.5)
nRBC: 0 % (ref 0.0–0.2)

## 2023-02-01 NOTE — ED Triage Notes (Signed)
Pt states that he ate 2 cookies and drank some water, after that he began feeling unwell, and concerned that he is having "a diabetic episode". Pt is unable to articulate anything specific past nausea. Denies any pain, or futher s/s. Pt feels that he needs to have a provider check him, and blood work drawn.

## 2023-02-01 NOTE — ED Notes (Signed)
Pt has left AMA.

## 2023-02-05 NOTE — Plan of Care (Signed)
[facility] Tonsillectomy/Adenoidectomy, Postoperative PEDS care plan entered in error.
